# Patient Record
Sex: Female | Born: 1945 | Race: Black or African American | Hispanic: No | Smoking: Never smoker
Health system: Southern US, Community
[De-identification: ages and names within clinical notes are randomized; demographics above are authoritative.]

## PROBLEM LIST (undated history)

## (undated) DIAGNOSIS — Z8673 Personal history of transient ischemic attack (TIA), and cerebral infarction without residual deficits: Secondary | ICD-10-CM

## (undated) DIAGNOSIS — Z794 Long term (current) use of insulin: Secondary | ICD-10-CM

## (undated) DIAGNOSIS — E785 Hyperlipidemia, unspecified: Secondary | ICD-10-CM

## (undated) DIAGNOSIS — I5042 Chronic combined systolic (congestive) and diastolic (congestive) heart failure: Secondary | ICD-10-CM

## (undated) DIAGNOSIS — I2510 Atherosclerotic heart disease of native coronary artery without angina pectoris: Secondary | ICD-10-CM

## (undated) DIAGNOSIS — E119 Type 2 diabetes mellitus without complications: Secondary | ICD-10-CM

## (undated) DIAGNOSIS — R1310 Dysphagia, unspecified: Secondary | ICD-10-CM

---

## 2014-10-02 HISTORY — PX: CARDIAC CATHETERIZATION: SHX172

## 2018-01-02 ENCOUNTER — Emergency Department (HOSPITAL_COMMUNITY): Payer: Medicare Other

## 2018-01-02 ENCOUNTER — Inpatient Hospital Stay (HOSPITAL_COMMUNITY)
Admission: EM | Admit: 2018-01-02 | Discharge: 2018-01-08 | DRG: 638 | Disposition: A | Discharge status: Hospice | Payer: Medicare Other | Attending: Internal Medicine | Admitting: Internal Medicine

## 2018-01-02 ENCOUNTER — Encounter (HOSPITAL_COMMUNITY): Payer: Self-pay | Admitting: Nurse Practitioner

## 2018-01-02 DIAGNOSIS — Z515 Encounter for palliative care: Secondary | ICD-10-CM | POA: Diagnosis present

## 2018-01-02 DIAGNOSIS — Z66 Do not resuscitate: Secondary | ICD-10-CM | POA: Diagnosis present

## 2018-01-02 DIAGNOSIS — G9349 Other encephalopathy: Secondary | ICD-10-CM | POA: Diagnosis present

## 2018-01-02 DIAGNOSIS — L89152 Pressure ulcer of sacral region, stage 2: Secondary | ICD-10-CM | POA: Diagnosis present

## 2018-01-02 DIAGNOSIS — I69391 Dysphagia following cerebral infarction: Secondary | ICD-10-CM

## 2018-01-02 DIAGNOSIS — E11649 Type 2 diabetes mellitus with hypoglycemia without coma: Principal | ICD-10-CM | POA: Diagnosis present

## 2018-01-02 DIAGNOSIS — E119 Type 2 diabetes mellitus without complications: Secondary | ICD-10-CM

## 2018-01-02 DIAGNOSIS — I5042 Chronic combined systolic (congestive) and diastolic (congestive) heart failure: Secondary | ICD-10-CM | POA: Diagnosis present

## 2018-01-02 DIAGNOSIS — Z955 Presence of coronary angioplasty implant and graft: Secondary | ICD-10-CM

## 2018-01-02 DIAGNOSIS — I69354 Hemiplegia and hemiparesis following cerebral infarction affecting left non-dominant side: Secondary | ICD-10-CM | POA: Diagnosis not present

## 2018-01-02 DIAGNOSIS — R627 Adult failure to thrive: Secondary | ICD-10-CM | POA: Diagnosis present

## 2018-01-02 DIAGNOSIS — I2510 Atherosclerotic heart disease of native coronary artery without angina pectoris: Secondary | ICD-10-CM | POA: Diagnosis present

## 2018-01-02 DIAGNOSIS — Z794 Long term (current) use of insulin: Secondary | ICD-10-CM | POA: Diagnosis not present

## 2018-01-02 DIAGNOSIS — I252 Old myocardial infarction: Secondary | ICD-10-CM

## 2018-01-02 DIAGNOSIS — E876 Hypokalemia: Secondary | ICD-10-CM | POA: Diagnosis present

## 2018-01-02 DIAGNOSIS — Z7982 Long term (current) use of aspirin: Secondary | ICD-10-CM

## 2018-01-02 DIAGNOSIS — E162 Hypoglycemia, unspecified: Secondary | ICD-10-CM | POA: Diagnosis not present

## 2018-01-02 DIAGNOSIS — R1310 Dysphagia, unspecified: Secondary | ICD-10-CM | POA: Diagnosis present

## 2018-01-02 DIAGNOSIS — Z7901 Long term (current) use of anticoagulants: Secondary | ICD-10-CM | POA: Diagnosis not present

## 2018-01-02 DIAGNOSIS — I69322 Dysarthria following cerebral infarction: Secondary | ICD-10-CM | POA: Diagnosis not present

## 2018-01-02 DIAGNOSIS — R0689 Other abnormalities of breathing: Secondary | ICD-10-CM | POA: Diagnosis present

## 2018-01-02 DIAGNOSIS — N390 Urinary tract infection, site not specified: Secondary | ICD-10-CM | POA: Diagnosis present

## 2018-01-02 DIAGNOSIS — I69320 Aphasia following cerebral infarction: Secondary | ICD-10-CM

## 2018-01-02 DIAGNOSIS — F0390 Unspecified dementia without behavioral disturbance: Secondary | ICD-10-CM | POA: Diagnosis present

## 2018-01-02 DIAGNOSIS — E785 Hyperlipidemia, unspecified: Secondary | ICD-10-CM | POA: Diagnosis present

## 2018-01-02 DIAGNOSIS — Z7401 Bed confinement status: Secondary | ICD-10-CM

## 2018-01-02 DIAGNOSIS — E860 Dehydration: Secondary | ICD-10-CM | POA: Diagnosis present

## 2018-01-02 DIAGNOSIS — Z8673 Personal history of transient ischemic attack (TIA), and cerebral infarction without residual deficits: Secondary | ICD-10-CM

## 2018-01-02 DIAGNOSIS — B9620 Unspecified Escherichia coli [E. coli] as the cause of diseases classified elsewhere: Secondary | ICD-10-CM | POA: Diagnosis present

## 2018-01-02 HISTORY — DX: Personal history of transient ischemic attack (TIA), and cerebral infarction without residual deficits: Z86.73

## 2018-01-02 HISTORY — DX: Chronic combined systolic (congestive) and diastolic (congestive) heart failure: I50.42

## 2018-01-02 HISTORY — DX: Long term (current) use of insulin: Z79.4

## 2018-01-02 HISTORY — DX: Dysphagia, unspecified: R13.10

## 2018-01-02 HISTORY — DX: Hyperlipidemia, unspecified: E78.5

## 2018-01-02 HISTORY — DX: Type 2 diabetes mellitus without complications: E11.9

## 2018-01-02 HISTORY — DX: Atherosclerotic heart disease of native coronary artery without angina pectoris: I25.10

## 2018-01-02 LAB — URINALYSIS, ROUTINE W REFLEX MICROSCOPIC
Bilirubin Urine: NEGATIVE
GLUCOSE, UA: 50 mg/dL — AB
KETONES UR: 5 mg/dL — AB
NITRITE: NEGATIVE
PROTEIN: 100 mg/dL — AB
Specific Gravity, Urine: 1.021 (ref 1.005–1.030)
pH: 6 (ref 5.0–8.0)

## 2018-01-02 LAB — BASIC METABOLIC PANEL
ANION GAP: 16 — AB (ref 5–15)
BUN: 16 mg/dL (ref 6–20)
CHLORIDE: 96 mmol/L — AB (ref 101–111)
CO2: 28 mmol/L (ref 22–32)
Calcium: 9.5 mg/dL (ref 8.9–10.3)
Creatinine, Ser: 0.74 mg/dL (ref 0.44–1.00)
GFR calc Af Amer: >60 mL/min (ref >60)
GLUCOSE: 54 mg/dL — AB (ref 65–99)
POTASSIUM: 3.1 mmol/L — AB (ref 3.5–5.1)
Sodium: 140 mmol/L (ref 135–145)

## 2018-01-02 LAB — GLUCOSE, CAPILLARY
GLUCOSE-CAPILLARY: 148 mg/dL — AB (ref 65–99)
GLUCOSE-CAPILLARY: 94 mg/dL (ref 65–99)

## 2018-01-02 LAB — CBC WITH DIFFERENTIAL/PLATELET
BASOS ABS: 0 10*3/uL (ref 0.0–0.1)
Basophils Relative: 0 %
Eosinophils Absolute: 0 10*3/uL (ref 0.0–0.7)
Eosinophils Relative: 0 %
HEMATOCRIT: 47.3 % — AB (ref 36.0–46.0)
Hemoglobin: 15 g/dL (ref 12.0–15.0)
LYMPHS ABS: 2.4 10*3/uL (ref 0.7–4.0)
LYMPHS PCT: 32 %
MCH: 28.4 pg (ref 26.0–34.0)
MCHC: 31.7 g/dL (ref 30.0–36.0)
MCV: 89.4 fL (ref 78.0–100.0)
MONO ABS: 0.6 10*3/uL (ref 0.1–1.0)
Monocytes Relative: 7 %
NEUTROS ABS: 4.5 10*3/uL (ref 1.7–7.7)
Neutrophils Relative %: 61 %
Platelets: 295 10*3/uL (ref 150–400)
RBC: 5.29 MIL/uL — AB (ref 3.87–5.11)
RDW: 15.2 % (ref 11.5–15.5)
WBC: 7.5 10*3/uL (ref 4.0–10.5)

## 2018-01-02 LAB — CBG MONITORING, ED
GLUCOSE-CAPILLARY: 73 mg/dL (ref 65–99)
GLUCOSE-CAPILLARY: 90 mg/dL (ref 65–99)
Glucose-Capillary: 127 mg/dL — ABNORMAL HIGH (ref 65–99)
Glucose-Capillary: 34 mg/dL — CL (ref 65–99)
Glucose-Capillary: 93 mg/dL (ref 65–99)

## 2018-01-02 LAB — MAGNESIUM: MAGNESIUM: 1.7 mg/dL (ref 1.7–2.4)

## 2018-01-02 LAB — I-STAT TROPONIN, ED: Troponin i, poc: 0 ng/mL (ref 0.00–0.08)

## 2018-01-02 LAB — PHOSPHORUS: PHOSPHORUS: 2.8 mg/dL (ref 2.5–4.6)

## 2018-01-02 LAB — MRSA PCR SCREENING: MRSA by PCR: NEGATIVE

## 2018-01-02 MED ORDER — DEXTROSE 10 % IV SOLN
INTRAVENOUS | Status: DC
  Administered 2018-01-02: 75 mL/h via INTRAVENOUS
  Administered 2018-01-03 – 2018-01-04 (×2): via INTRAVENOUS
  Administered 2018-01-04 (×2): 1000 mL via INTRAVENOUS
  Administered 2018-01-05 – 2018-01-06 (×3): via INTRAVENOUS
  Filled 2018-01-02: qty 1000

## 2018-01-02 MED ORDER — POTASSIUM CHLORIDE 10 MEQ/100ML IV SOLN
10.0000 meq | INTRAVENOUS | Status: AC
  Administered 2018-01-02: 10 meq via INTRAVENOUS
  Filled 2018-01-02: qty 100

## 2018-01-02 MED ORDER — ONDANSETRON HCL 4 MG/2ML IJ SOLN: 4.0000 mg | Freq: Four times a day (QID) | INTRAMUSCULAR | Status: DC | PRN

## 2018-01-02 MED ORDER — DEXTROSE 50 % IV SOLN
1.0000 | Freq: Once | INTRAVENOUS | Status: AC
  Administered 2018-01-02: 50 mL via INTRAVENOUS
  Filled 2018-01-02: qty 50

## 2018-01-02 MED ORDER — ONDANSETRON HCL 4 MG PO TABS: 4.0000 mg | ORAL_TABLET | Freq: Four times a day (QID) | ORAL | Status: DC | PRN

## 2018-01-02 MED ORDER — POTASSIUM CHLORIDE 10 MEQ/100ML IV SOLN
10.0000 meq | INTRAVENOUS | Status: AC
  Administered 2018-01-02 (×2): 10 meq via INTRAVENOUS
  Filled 2018-01-02 (×2): qty 100

## 2018-01-02 MED ORDER — SODIUM CHLORIDE 0.9 % IV BOLUS
500.0000 mL | Freq: Once | INTRAVENOUS | Status: AC
  Administered 2018-01-02: 500 mL via INTRAVENOUS

## 2018-01-02 MED ORDER — APIXABAN 5 MG PO TABS
5.0000 mg | ORAL_TABLET | Freq: Two times a day (BID) | ORAL | Status: DC
  Filled 2018-01-02 (×5): qty 1

## 2018-01-02 MED ORDER — ACETAMINOPHEN 650 MG RE SUPP: 650.0000 mg | Freq: Four times a day (QID) | RECTAL | Status: DC | PRN

## 2018-01-02 MED ORDER — POTASSIUM CHLORIDE 10 MEQ/100ML IV SOLN
10.0000 meq | Freq: Once | INTRAVENOUS | Status: AC
  Administered 2018-01-02: 10 meq via INTRAVENOUS
  Filled 2018-01-02: qty 100

## 2018-01-02 MED ORDER — ACETAMINOPHEN 325 MG PO TABS: 650.0000 mg | ORAL_TABLET | Freq: Four times a day (QID) | ORAL | Status: DC | PRN

## 2018-01-02 NOTE — Discharge Planning (Signed)
Pt newphew, Jonathan [facility].  Carley Hammedva of [facility] states they were to visit pt today to begin services.  Please notify [facility] if plans change.

## 2018-01-02 NOTE — Progress Notes (Signed)
Attempted to get report on pt.  

## 2018-01-02 NOTE — H&P (Signed)
History and Physical    Heather Schultz ZOX:[id number] DOB: 03/28/1946 DOA: 01/02/2018  **Will admit patient based on the expectation that the patient will need hospitalization/ hospital care that crosses at least 2 midnights  PCP: Macario Carls, PA-C in [place] [phone]  Attending physician: Ophelia Charter  Patient coming from/Resides with: [facility]  Chief Complaint: Altered mental status, severe hypoglycemia  HPI: Heather Schultz is a 72 y.o. female with medical history significant for multiple CVA with the last one 3 weeks ago, history of CAD and prior MI with stents, chronic combined systolic and diastolic heart failure, diabetes mellitus on insulin, dysphagia and failure to thrive.  According to family at the bedside patient had 2 strokes 3 years ago and most recent stroke was about 3-4 weeks ago.  She was initially at [facility] health at that time she was ambulating with assistance and a rolling walker.  Over the past several weeks she has subsequently been transferred to new nursing facility and has had progressive decline in function.  She has stopped eating in the context of dislike of pured diet with thickened liquids.  She is also refused her medications.  She has had mottling of the hands and legs for several months.  Is recently been made a DNR and placed on hospice because of all of these changes.  Today when patient was found unresponsive her CBG was checked and was 16.  EMS was called and she was given IV glucagon with improvement in CBG to 73.  She has subsequently been started on a D10 infusion in the ER at 75/HR.  She is POA/nephew was contacted by ED staff who confirms DNR status but wishes to treat reversible causes stating to ED PA that "we would like everything done up until she flat lines i.e. codes if possible" ER patient did not have leukocytosis.  She did have hypokalemia and serum glucose 54.  Urinalysis is pending.  She is receiving normal saline  bolus and dextrose as above.  Upon my interaction with the patient she was tracking me visually in the room.  She was able to follow commands.  She stated that she was "hungry".  Review of nursing facility medication sheet the following medications have recently been discontinued: Lisinopril, ranitidine, atorvastatin, Zetia and metformin.  Patient's Evaristo Bury insulin was decreased from 60 units to 30 units daily on 4/2.  ED Course:  Vital Signs: BP (!) 171/86   Pulse 91   Temp 98 F (36.7 C) (Rectal)   Resp 16   SpO2 99%  CXR: Neg Pertinent lab data: Sodium 140, potassium 3.1, chloride 96, glucose 54, BUN 16, creatinine 0.74, anion gap 16, hemoglobin 15, hematocrit 47.3, platelets 295,000, white count 7500 with normal differential, urinalysis and culture pending collection Medications and treatments: Potassium 10 mEq x1, normal saline 500 cc x1, D50 1 amp x1, D10 infusion at 75/hr  Review of Systems:  **Unable to obtain from patient due to altered mental status, history of stroke and significant dysarthria **From family member at bedside and from limited medical records sent with patient from SNF   Past Medical History:  Diagnosis Date  . CAD (coronary artery disease) MI w/stents 2016   . Chronic combined systolic and diastolic CHF (congestive heart failure) (HCC)   . Diabetes mellitus type 2, insulin dependent (HCC)   . Dysphagia   . History of CVA (cerebrovascular accident)   . HLD (hyperlipidemia)     Past Surgical History:  Procedure Laterality Date  .  CARDIAC CATHETERIZATION  2016    Social History   Socioeconomic History  . Marital status: Unknown    Spouse name: Not on file  . Number of children: Not on file  . Years of education: Not on file  . Highest education level: Not on file  Occupational History  . Not on file  Social Needs  . Financial resource strain: Not on file  . Food insecurity:    Worry: Not on file    Inability: Not on file  . Transportation needs:     Medical: Not on file    Non-medical: Not on file  Tobacco Use  . Smoking status: Never Smoker  . Smokeless tobacco: Never Used  Substance and Sexual Activity  . Alcohol use: Never    Frequency: Never  . Drug use: Never  . Sexual activity: Not Currently  Lifestyle  . Physical activity:    Days per week: 0 days    Minutes per session: 0 min  . Stress: Not on file  Relationships  . Social connections:    Talks on phone: Not on file    Gets together: Not on file    Attends religious service: Not on file    Active member of club or organization: Not on file    Attends meetings of clubs or organizations: Not on file    Relationship status: Not on file  . Intimate partner violence:    Fear of current or ex partner: Not on file    Emotionally abused: Not on file    Physically abused: Not on file    Forced sexual activity: Not on file  Other Topics Concern  . Not on file  Social History Narrative   DO NOT RESUSCITATE    Mobility: Bed bound Work history: Not obtained   No Known Allergies  Family History  Family history unknown: Yes     Prior to Admission medications   Medication Sig Start Date End Date Taking? Authorizing Provider  acetaminophen (TYLENOL) 325 MG tablet Take 650 mg by mouth every 4 (four) hours as needed for mild pain.   Yes [provider]  apixaban (ELIQUIS) 5 MG TABS tablet Take 5 mg by mouth 2 (two) times daily.   Yes [provider]  aspirin 81 MG chewable tablet Chew 81 mg by mouth daily.   Yes [provider]  carvedilol (COREG) 25 MG tablet Take 25 mg by mouth 2 (two) times daily with a meal.   Yes [provider]  dextromethorphan-guaiFENesin (MUCINEX DM) 30-600 MG 12hr tablet Take 2 tablets by mouth daily as needed for cough.   Yes [provider]  furosemide (LASIX) 20 MG tablet Take 10 mg by mouth daily.   Yes [provider]  Insulin Degludec (TRESIBA FLEXTOUCH) 200 UNIT/ML SOPN Inject 30  Units into the skin daily.   Yes [provider]  mirtazapine (REMERON) 7.5 MG tablet Take 7.5 mg by mouth at bedtime.   Yes [provider]  nitroGLYCERIN (NITROSTAT) 0.4 MG SL tablet Place 0.4 mg under the tongue every 5 (five) minutes as needed for chest pain.   Yes [provider]  PARoxetine (PAXIL) 20 MG tablet Take 20 mg by mouth daily.   Yes [provider]    Physical Exam: Vitals:   01/02/18 0849 01/02/18 0915 01/02/18 0930 01/02/18 1023  BP:    (!) 171/86  Pulse:  88 92 91  Resp:      Temp: 98 F (36.7  C)     TempSrc: Rectal     SpO2:  98% 99% 99%      Constitutional: NAD, calm, comfortable Eyes: PERRL, lids and conjunctivae normal ENMT: Mucous membranes are dry. Posterior pharynx clear of any exudate or lesions.poor dentition Neck: normal, supple, no masses, no thyromegaly Respiratory: clear to auscultation bilaterally, no wheezing, no crackles. Normal respiratory effort. No accessory muscle use.  Cardiovascular: Regular rate and rhythm, no murmurs / rubs / gallops. No extremity edema. 1+ pedal pulses. No carotid bruits.  Mottling of hands and feet as described below. Abdomen: no tenderness, no masses palpated. No hepatosplenomegaly. Bowel sounds positive.  Musculoskeletal: no clubbing / cyanosis. No joint deformity upper and lower extremities. Good ROM, no contractures. Normal muscle tone.  Skin: no rashes, lesions, ulcers. No induration-marked mottling of right hand and bilateral lower extremities from mid anterior tibs down with right hand and feet lower legs extremely cool to touch. Neurologic: CN 2-12 grossly intact-mild baseline bilateral vertical nystagmus at rest without testing.  Tongue midline on testing.  Sensation intact, DTR normal. Strength 4/5 x RUE/RLE; dense hemiplegia LUE with strength 1/5 LLE.  Minimally verbal interactive although was able to state that she was hungry.  Tracks examiner around the room with eyes.  Able to  follow commands easily. Psychiatric: Alert and oriented x name.  Flat affect   Labs on Admission: I have personally reviewed following labs and imaging studies  CBC: Recent Labs  Lab 01/02/18 0804  WBC 7.5  NEUTROABS 4.5  HGB 15.0  HCT 47.3*  MCV 89.4  PLT 295   Basic Metabolic Panel: Recent Labs  Lab 01/02/18 0804  NA 140  K 3.1*  CL 96*  CO2 28  GLUCOSE 54*  BUN 16  CREATININE 0.74  CALCIUM 9.5   GFR: CrCl cannot be calculated (Unknown ideal weight.). Liver Function Tests: No results for input(s): AST, ALT, ALKPHOS, BILITOT, PROT, ALBUMIN in the last 168 hours. No results for input(s): LIPASE, AMYLASE in the last 168 hours. No results for input(s): AMMONIA in the last 168 hours. Coagulation Profile: No results for input(s): INR, PROTIME in the last 168 hours. Cardiac Enzymes: No results for input(s): CKTOTAL, CKMB, CKMBINDEX, TROPONINI in the last 168 hours. BNP (last 3 results) No results for input(s): PROBNP in the last 8760 hours. HbA1C: No results for input(s): HGBA1C in the last 72 hours. CBG: Recent Labs  Lab 01/02/18 0755 01/02/18 1032 01/02/18 1049 01/02/18 1145  GLUCAP 73 34* 127* 90   Lipid Profile: No results for input(s): CHOL, HDL, LDLCALC, TRIG, CHOLHDL, LDLDIRECT in the last 72 hours. Thyroid Function Tests: No results for input(s): TSH, T4TOTAL, FREET4, T3FREE, THYROIDAB in the last 72 hours. Anemia Panel: No results for input(s): VITAMINB12, FOLATE, FERRITIN, TIBC, IRON, RETICCTPCT in the last 72 hours. Urine analysis: No results found for: COLORURINE, APPEARANCEUR, LABSPEC, PHURINE, GLUCOSEU, HGBUR, BILIRUBINUR, KETONESUR, PROTEINUR, UROBILINOGEN, NITRITE, LEUKOCYTESUR Sepsis Labs: @LABRCNTIP (procalcitonin:4,lacticidven:4) )No results found for this or any previous visit (from the past 240 hour(s)).   Radiological Exams on Admission: Dg Chest 1 View  Result Date: 01/02/2018 CLINICAL DATA:  Hypoglycemia.  Abnormal breath sounds  EXAM: CHEST  1 VIEW COMPARISON:  None. FINDINGS: There is minimal atelectasis in the left base. Lungs elsewhere are clear. Heart size and pulmonary vascularity are normal. No adenopathy. There is a loop recorder on the left inferiorly. IMPRESSION: Minimal left base atelectasis. No edema or consolidation. Heart size normal. Electronically Signed   By: Bretta Bang III M.D.  On: 01/02/2018 09:22    EKG: (Independently reviewed)   Assessment/Plan Principal Problem:   Hypoglycemia/ Diabetes mellitus type 2, insulin dependent  -Patient presents with altered mental status in the context of symptomatic hypoglycemia with presenting CBG 16 that has improved after administration of glucose IV -Patient with significant failure to thrive noting she was not eating or drinking for multiple days prior to presentation -Hold insulin degludec -CBGs every 4 hours -Encourage oral intake; family member has noted that with patient on thickened liquids and pured diet patient has not wanted to eat as previous -Until patient begins to eat regular amounts suggest to withhold long-acting insulin in favor of following CBGs and providing SSI -Look for occult infections therefore will follow up on urinalysis, urine culture and blood cultures obtained in ER-no obvious signs of infection so no indication to initiate empiric antibiotics  Active Problems:   Acute hypokalemia -Secondary to poor intake, dehydration and recent concomitant administration of Lasix -IV replete -Follow labs    FTT (failure to thrive) in adult/Dysphagia -Patient with recurrent CVA with most recent about 3-4 weeks ago with worsening of left-sided deficits, difficulty communicating with persistent dysarthria and profound dysphagia -We will allow for pured diet with honey thickened liquids but asked for SLP evaluation in a.m.-HOB elevated 60 degrees with feeding and meds -Patient recently made DNR and placed on hospice status -She appears  quite dehydrated as evidenced by a cool mottled extremities-continue hydration    History of CVA (cerebrovascular accident) -Irving Burtonmily reports 4 weeks ago patient was ambulatory with assistance and rolling walker -PT/OT evaluation -Patient on full dose Eliquis-medical history not available but no apparent history of atrial fibrillation so suspect full dose anticoagulation secondary to recurrent strokes    Chronic combined systolic and diastolic CHF (congestive heart failure)  -Appears euvolemic and dry -Recently Lasix and lisinopril discontinued in context of failure to thrive    HLD (hyperlipidemia) -Statin recently discontinued    CAD (coronary artery disease) w/ prior MI/stents 2016 -Statin recently discontinued -On Eliquis and aspirin    **Additional lab, imaging and/or diagnostic evaluation at discretion of supervising physician  DVT prophylaxis: Eliquis Code Status: DO NOT RESUSCITATE Family Communication: Family member at bedside Disposition Plan: SNF Consults called: None    Ralphine Hinks L. ANP-BC [facility] Pager [phone]   If 7PM-7AM, please contact night-coverage [email or web address] Password [email or web address]  01/02/2018, 12:10 PM

## 2018-01-02 NOTE — ED Notes (Signed)
Pt not tolerating Potassium infusion well, rate decreased to 50 mL/hr, pt tolerating well with new rate

## 2018-01-02 NOTE — ED Triage Notes (Signed)
Pt arrives from [facility] for low blood sugar, ems arrived and found a unresponsive patient with a blood sugar of 16. Ems administered 1 mg of IM glucagon and pt's blood sugar 73 on arrival to ED. Pt is non verbal but alert to voice, pt tries to speak but makes raspy noise. Pt arrives with a DNR that appears to be placed in affect yesterday. Nursing home was unable to give ems any valuable information regarding history they only told ems that patient was a new hospice patient. pt's lower legs appear mottled, pt has noisy respirations.

## 2018-01-02 NOTE — ED Provider Notes (Signed)
[facility] EMERGENCY DEPARTMENT Provider Note   CSN: [id number] Arrival date & time:        History   Chief Complaint Chief Complaint  Patient presents with  . Hypoglycemia    HPI Amie Cowens is a 72 y.o. female who is currently DNR and on hospice effective yesterday for multiple CVA, cardiac issues, who presents to ED for hypoglycemia.  Per EMS, patient was found unresponsive and had a blood sugar of 16.  EMS administered 1 mg of IM glucagon with improvement in blood sugar to 73.  Patient is nonverbal at baseline.  Remainder of history is limited due to patient's condition. Spoke to patient's nephew and niece who states that patient, up until yesterday, was on physical therapy and speech therapy for progress after CVAs.  However, she gradually began rejecting her food and medications so they placed her on hospice.  She currently resides at [facility].  Per Loraine Leriche, all medications were discontinued with the exception of Lasix, carvedilol, Eliquis.  HPI  No past medical history on file.  Patient Active Problem List   Diagnosis Date Noted  . Hypoglycemia 01/02/2018      OB History   None      Home Medications    Prior to Admission medications   Medication Sig Start Date End Date Taking? Authorizing Provider  acetaminophen (TYLENOL) 325 MG tablet Take 650 mg by mouth every 4 (four) hours as needed for mild pain.   Yes [provider]  apixaban (ELIQUIS) 5 MG TABS tablet Take 5 mg by mouth 2 (two) times daily.   Yes [provider]  aspirin 81 MG chewable tablet Chew 81 mg by mouth daily.   Yes [provider]  carvedilol (COREG) 25 MG tablet Take 25 mg by mouth 2 (two) times daily with a meal.   Yes [provider]  dextromethorphan-guaiFENesin (MUCINEX DM) 30-600 MG 12hr tablet Take 2 tablets by mouth daily as needed for cough.   Yes [provider]  furosemide (LASIX) 20 MG tablet Take 10 mg  by mouth daily.   Yes [provider]  Insulin Degludec (TRESIBA FLEXTOUCH) 200 UNIT/ML SOPN Inject 30 Units into the skin daily.   Yes [provider]  mirtazapine (REMERON) 7.5 MG tablet Take 7.5 mg by mouth at bedtime.   Yes [provider]  nitroGLYCERIN (NITROSTAT) 0.4 MG SL tablet Place 0.4 mg under the tongue every 5 (five) minutes as needed for chest pain.   Yes [provider]  PARoxetine (PAXIL) 20 MG tablet Take 20 mg by mouth daily.   Yes [provider]    Family History No family history on file.  Social History Social History   Tobacco Use  . Smoking status: Not on file  Substance Use Topics  . Alcohol use: Not on file  . Drug use: Not on file     Allergies   Patient has no known allergies.   Review of Systems Review of Systems  Unable to perform ROS: Patient nonverbal     Physical Exam Updated Vital Signs BP (!) 171/86   Pulse 91   Temp 98 F (36.7 C) (Rectal)   Resp 16   SpO2 99%   Physical Exam  Constitutional: She appears well-developed and well-nourished. No distress.  NAD. Able to nod and shake head when asked questions.  HENT:  Head: Normocephalic and atraumatic.  Nose: Nose normal.  Eyes: Pupils are equal, round, and reactive to  light. Conjunctivae and EOM are normal. Right eye exhibits no discharge. Left eye exhibits no discharge. No scleral icterus.  Cardiovascular: Normal rate, regular rhythm, normal heart sounds and intact distal pulses. Exam reveals no gallop and no friction rub.  No murmur heard. Pulmonary/Chest: Effort normal and breath sounds normal. No respiratory distress.  Coarse breath sounds in bilateral lung fields.  Abdominal: Soft. Bowel sounds are normal. She exhibits no distension. There is no tenderness. There is no guarding.  Musculoskeletal: She exhibits no edema.  Mottled lower extremities bilaterally. Cool to touch.  Neurological: She is alert.  Head turned to R side.    Skin: Skin is warm and dry. No rash noted. She is not diaphoretic.  Psychiatric: She has a normal mood and affect.  Nursing note and vitals reviewed.    ED Treatments / Results  Labs (all labs ordered are listed, but only abnormal results are displayed) Labs Reviewed  BASIC METABOLIC PANEL - Abnormal; Notable for the following components:      Result Value   Potassium 3.1 (*)    Chloride 96 (*)    Glucose, Bld 54 (*)    Anion gap 16 (*)    All other components within normal limits  CBC WITH DIFFERENTIAL/PLATELET - Abnormal; Notable for the following components:   RBC 5.29 (*)    HCT 47.3 (*)    All other components within normal limits  CBG MONITORING, ED - Abnormal; Notable for the following components:   Glucose-Capillary 34 (*)    All other components within normal limits  CBG MONITORING, ED - Abnormal; Notable for the following components:   Glucose-Capillary 127 (*)    All other components within normal limits  URINE CULTURE  URINALYSIS, ROUTINE W REFLEX MICROSCOPIC  URINALYSIS, ROUTINE W REFLEX MICROSCOPIC  CBG MONITORING, ED  I-STAT TROPONIN, ED    EKG EKG Interpretation  Date/Time:  Wednesday January 02 2018 08:11:55 EDT Ventricular Rate:  96 PR Interval:    QRS Duration: 93 QT Interval:  368 QTC Calculation: 465 R Axis:   66 Text Interpretation:  Sinus rhythm Probable anteroseptal infarct, old some artifact No old tracing to compare Confirmed by Jerelyn Scott [phone]) on 01/02/2018 8:17:25 AM   Radiology Dg Chest 1 View  Result Date: 01/02/2018 CLINICAL DATA:  Hypoglycemia.  Abnormal breath sounds EXAM: CHEST  1 VIEW COMPARISON:  None. FINDINGS: There is minimal atelectasis in the left base. Lungs elsewhere are clear. Heart size and pulmonary vascularity are normal. No adenopathy. There is a loop recorder on the left inferiorly. IMPRESSION: Minimal left base atelectasis. No edema or consolidation. Heart size normal. Electronically Signed   By: Bretta Bang  III M.D.   On: 01/02/2018 09:22    Procedures Procedures (including critical care time)  CRITICAL CARE Performed by: Dietrich Pates   Total critical care time: 35 minutes  Critical care time was exclusive of separately billable procedures and treating other patients.  Critical care was necessary to treat or prevent imminent or life-threatening deterioration.  Critical care was time spent personally by me on the following activities: development of treatment plan with patient and/or surrogate as well as nursing, discussions with consultants, evaluation of patient's response to treatment, examination of patient, obtaining history from patient or surrogate, ordering and performing treatments and interventions, ordering and review of laboratory studies, ordering and review of radiographic studies, pulse oximetry and re-evaluation of patient's condition.   Medications Ordered in ED Medications  potassium chloride 10 mEq in 100 mL IVPB (  10 mEq Intravenous New Bag/Given 01/02/18 1027)  sodium chloride 0.9 % bolus 500 mL (500 mLs Intravenous New Bag/Given 01/02/18 1028)  dextrose 10 % infusion (has no administration in time range)  potassium chloride 10 mEq in 100 mL IVPB (has no administration in time range)  dextrose 50 % solution 50 mL (50 mLs Intravenous Given 01/02/18 1036)     Initial Impression / Assessment and Plan / ED Course  I have reviewed the triage vital signs and the nursing notes.  Pertinent labs & imaging results that were available during my care of the patient were reviewed by me and considered in my medical decision making (see chart for details).     Patient presents to ED for evaluation of hypoglycemia.  Patient resides at a nursing facility.  She was placed under hospice and DNR effective yesterday.  It appears that patient has had multiple strokes and stents placed in the past.  She was going through speech and physical therapy until yesterday.  Her family states that she  has gradually been rejecting her food and medications.  They found her unresponsive earlier this morning with glucose of 16.  This responded well to IV glucagon.  Patient's lab work significant for hypokalemia at 3.1 and glucose as low as 34 here.  I spoke to and explained to the family about the patient's condition and the fact that she will need to be on a D10 gtt to help maintain her blood sugars.  Not sure if they are fully grasping the patient's condition, as they ask "so she will be on the drip until her glucose is normal?" I explained to them that the reason the patient's glucose is low is because of her PO intake so unless this improves, she may need to be on the gtt. They would like to pursue all measures until the time of resuscitation if needed.  Spoke to the hospitalist for admission for hypoglycemia. Appreciate the help of hospitalist for management of this patient.  Portions of this note were generated with Scientist, clinical (histocompatibility and immunogenetics)Dragon dictation software. Dictation errors may occur despite best attempts at proofreading.   Final Clinical Impressions(s) / ED Diagnoses   Final diagnoses:  Hypoglycemia    ED Discharge Orders    None       Dietrich PatesKhatri, Aayushi Solorzano, PA-C 01/02/18 1116    Mabe, Latanya MaudlinMartha L, MD 01/02/18 1130

## 2018-01-02 NOTE — Progress Notes (Signed)
Heather Schultz is a 72 y.o. female patient admitted from ED awake, alert - nonverbal (base line) - no acute distress noted.  VSS - Blood pressure (!) 141/75, pulse 92, temperature 98 F (36.7 C), temperature source Rectal, resp. rate 20, SpO2 99 %.    IV in place, occlusive dsg intact without redness.  SR up x 2, fall assessment complete. Pt has a stage 2 pressure injury on her sacrum. No other evidence of skin break down noted on exam.     Will cont to eval and treat per MD orders.  Theodosia BlenderEvan J Windsor Goeken, RN 01/02/2018 5:33 PM

## 2018-01-02 NOTE — ED Notes (Signed)
PA at bedside, plan to speak with family re: plan of care, pt has DNR paper with effective date 01/01/18, will contyinue to monitor

## 2018-01-02 NOTE — ED Notes (Signed)
PA aware of CBG, plan to start dextrose infusion

## 2018-01-03 LAB — CBC
HCT: 40.3 % (ref 36.0–46.0)
Hemoglobin: 12.6 g/dL (ref 12.0–15.0)
MCH: 28.1 pg (ref 26.0–34.0)
MCHC: 31.3 g/dL (ref 30.0–36.0)
MCV: 90 fL (ref 78.0–100.0)
PLATELETS: 261 10*3/uL (ref 150–400)
RBC: 4.48 MIL/uL (ref 3.87–5.11)
RDW: 15.4 % (ref 11.5–15.5)
WBC: 7.1 10*3/uL (ref 4.0–10.5)

## 2018-01-03 LAB — COMPREHENSIVE METABOLIC PANEL
ALT: 19 U/L (ref 14–54)
AST: 29 U/L (ref 15–41)
Albumin: 3 g/dL — ABNORMAL LOW (ref 3.5–5.0)
Alkaline Phosphatase: 71 U/L (ref 38–126)
Anion gap: 12 (ref 5–15)
BUN: 12 mg/dL (ref 6–20)
CHLORIDE: 97 mmol/L — AB (ref 101–111)
CO2: 25 mmol/L (ref 22–32)
CREATININE: 0.92 mg/dL (ref 0.44–1.00)
Calcium: 8.3 mg/dL — ABNORMAL LOW (ref 8.9–10.3)
GFR calc non Af Amer: >60 mL/min (ref >60)
Glucose, Bld: 94 mg/dL (ref 65–99)
Potassium: 3 mmol/L — ABNORMAL LOW (ref 3.5–5.1)
SODIUM: 134 mmol/L — AB (ref 135–145)
Total Bilirubin: 1.4 mg/dL — ABNORMAL HIGH (ref 0.3–1.2)
Total Protein: 6.1 g/dL — ABNORMAL LOW (ref 6.5–8.1)

## 2018-01-03 LAB — GLUCOSE, CAPILLARY
Glucose-Capillary: 89 mg/dL (ref 65–99)
Glucose-Capillary: 115 mg/dL — ABNORMAL HIGH (ref 65–99)
Glucose-Capillary: 97 mg/dL (ref 65–99)
Glucose-Capillary: 127 mg/dL — ABNORMAL HIGH (ref 65–99)

## 2018-01-03 MED ORDER — RESOURCE THICKENUP CLEAR PO POWD
ORAL | Status: DC | PRN
  Filled 2018-01-03: qty 125

## 2018-01-03 MED ORDER — SODIUM CHLORIDE 0.9 % IV SOLN
1.0000 g | INTRAVENOUS | Status: AC
  Administered 2018-01-03 – 2018-01-07 (×5): 1 g via INTRAVENOUS
  Filled 2018-01-03: qty 10
  Filled 2018-01-03: qty 1
  Filled 2018-01-03 (×3): qty 10

## 2018-01-03 NOTE — Evaluation (Signed)
Clinical/Bedside Swallow Evaluation Patient Details  Name: Anna Beaird MRN: [id number] Date of Birth: 09-05-1946  Today's Date: 01/03/2018 Time: SLP Start Time (ACUTE ONLY): 0820 SLP Stop Time (ACUTE ONLY): 0850 SLP Time Calculation (min) (ACUTE ONLY): 30 min  Past Medical History:  Past Medical History:  Diagnosis Date  . CAD (coronary artery disease) MI w/stents 2016   . Chronic combined systolic and diastolic CHF (congestive heart failure) (HCC)   . Diabetes mellitus type 2, insulin dependent (HCC)   . Dysphagia   . History of CVA (cerebrovascular accident)   . HLD (hyperlipidemia)    Past Surgical History:  Past Surgical History:  Procedure Laterality Date  . CARDIAC CATHETERIZATION  2016   HPI:  Noralee Dutko is a 72 y.o. female who is currently DNR and on hospice effective 4/2 for multiple CVA, cardiac issues, who presents to ED for hypoglycemia.  Per EMS, patient was found unresponsive and had a blood sugar of 16.  Patient is nonverbal at baseline. She gradually began rejecting her food and medications so they placed her on hospice.  She currently resides at [facility].  Per report, all medications were discontinued with the exception of Lasix, carvedilol, Eliquis.   Assessment / Plan / Recommendation Clinical Impression    Pt presents with severe oropharyngeal dysphagia and concomitant aspiration risk across all consistencies.  Pt has generalized oral motor weakness and significant cognitive deficits from multiple previous CVAs and acute decompensation with resultant absence of oral manipulation of boluses.  Pt would orally accept boluses but was incapable of containing or transiting boluses posteriorly.  As a result, all boluses completely spilled out of the oral cavity anteriorly or had to be manually removed from pt's mouth.  Pt had little interest in teaspoons of liquids or purees; however, she did express pleasure via facial expressions and yes/no  responses when presented with moist oral swabs.  Can not recommend a safe diet at this time given the deficits mentioned above.  Suspect that any POs at this time would be given with the intention of maximizing pt's comfort and quality of life; however, since no family is present to confirm that comfort feeds are within pt's wishes and pt can not express her wishes herself, will recommend NPO, meds crushed in puree at this time with frequent oral care and moist swabs for comfort.    Will follow up while inpatient in the hopes of providing family education for comfort feeds if that is the pt's and family's wish. SLP Visit Diagnosis: Dysphagia, oropharyngeal phase (R13.12)    Aspiration Risk  Severe aspiration risk;Risk for inadequate nutrition/hydration    Diet Recommendation NPO except meds(moist mouth swabs following oral care)   Liquid Administration via: Spoon Medication Administration: Crushed with puree Supervision: Full supervision/cueing for compensatory strategies Compensations: Other (Comment);Small sips/bites(feeds for comfort)    Other  Recommendations Oral Care Recommendations: Oral care QID   Follow up Recommendations Other (comment)(hospice)      Frequency and Duration min 1 x/week          Prognosis Prognosis for Safe Diet Advancement: (na, pt appears to be nearing end of life) Barriers to Reach Goals: Other (Comment)(na, pt appears to be nearing end of life)      Swallow Study   General Date of Onset: (see HPI) HPI: Ikeisha Blumberg is a 72 y.o. female who is currently DNR and on hospice effective 4/2 for multiple CVA, cardiac issues, who presents to ED for hypoglycemia.  Per EMS,  patient was found unresponsive and had a blood sugar of 16.  Patient is nonverbal at baseline. She gradually began rejecting her food and medications so they placed her on hospice.  She currently resides at [facility].  Per report, all medications were discontinued with the  exception of Lasix, carvedilol, Eliquis. Type of Study: Bedside Swallow Evaluation Previous Swallow Assessment: none on record, per RN at SNF, nursing and SLP were trying thickened liquids recently due to coughing and pocketing during meals and pt was refusing to eat Diet Prior to this Study: Dysphagia 1 (puree);Honey-thick liquids Temperature Spikes Noted: No Respiratory Status: Room air History of Recent Intubation: No Behavior/Cognition: Requires cueing;Confused;Cooperative;Alert Oral Cavity Assessment: Dry Oral Care Completed by SLP: Yes Oral Cavity - Dentition: Adequate natural dentition Self-Feeding Abilities: Total assist Patient Positioning: Upright in bed Baseline Vocal Quality: Other (comment)(aphonic) Volitional Cough: Cognitively unable to elicit Volitional Swallow: Unable to elicit    Oral/Motor/Sensory Function Overall Oral Motor/Sensory Function: Generalized oral weakness   Ice Chips     Thin Liquid Thin Liquid: Impaired Presentation: Spoon Oral Phase Impairments: Reduced labial seal;Reduced lingual movement/coordination;Impaired mastication;Poor awareness of bolus Oral Phase Functional Implications: Right anterior spillage;Left anterior spillage;Prolonged oral transit;Right lateral sulci pocketing;Oral residue;Oral holding Pharyngeal  Phase Impairments: Unable to trigger swallow    Nectar Thick     Honey Thick     Puree Puree: Impaired Presentation: Spoon Oral Phase Impairments: Reduced labial seal;Reduced lingual movement/coordination;Poor awareness of bolus Oral Phase Functional Implications: Right anterior spillage;Right lateral sulci pocketing;Oral residue;Oral holding;Prolonged oral transit Pharyngeal Phase Impairments: Unable to trigger swallow   Solid   GO            Herschel Fleagle, Melanee SpryNicole L 01/03/2018,9:22 AM

## 2018-01-03 NOTE — Progress Notes (Signed)
OT Cancellation Note and Discharge (Defer to Next Venue of Care)  Patient Details Name: Heather Schultz MRN: [id number] DOB: 1946/06/25   Cancelled Treatment:    Reason Eval/Treat Not Completed: OT screened, will defer OT to SNF where the patient resides for better environmental evaluation for self-feeding as per notes she is non-verbal, and bed bound - requiring assist for all ADL.   Evern BioLaura J Macari Zalesky 01/03/2018, 5:55 PM  Sherryl MangesLaura Nikko Goldwire OTR/L [phone]

## 2018-01-03 NOTE — Progress Notes (Signed)
Pt did not void through night. Bladder scan completed 3 times with volume not reaching over 150 ml.

## 2018-01-03 NOTE — Progress Notes (Signed)
PT Cancellation Note  Patient Details Name: Heather Schultz MRN: [id number] DOB: 09/24/1946   Cancelled Treatment:    Reason Eval/Treat Not Completed: PT screened, no needs identified, will sign off.  Pt is not able to assist with purposeful movement per nsg, unable to benefit from PT and is very fragile.  Will sign off for now and will be glad to come back if needs are identified that are not evident now.   Ivar DrapeRuth E Adlynn Lowenstein 01/03/2018, 8:30 AM   Samul Dadauth Caydance Kuehnle, PT MS Acute Rehab Dept. Number: [facility] [id number] and [facility] [phone]

## 2018-01-03 NOTE — Progress Notes (Signed)
PROGRESS NOTE  Jacquelene Kopecky ZOX:[id number] DOB: [birth year] DOA: 01/02/2018 PCP: Patient, No Pcp Per   LOS: 1 day   Brief Narrative / Interim history: 72 y.o. female with a Past Medical History of HLD, h/o CVA with resultant nonverbal status, DM, chronic combined CHF, and CAD with stents who presents with AMS and hypoglycemia.  The patient was found unresponsive at her facility and her blood sugar by EMS was 16.  She has recently been rejecting food and medications at SNF and has been placed on Hospice  Assessment & Plan: Principal Problem:   Hypoglycemia Active Problems:   Acute hypokalemia   FTT (failure to thrive) in adult   History of CVA (cerebrovascular accident)   Dysphagia   HLD (hyperlipidemia)   CAD (coronary artery disease) w/ prior MT/stents 2016   Diabetes mellitus type 2, insulin dependent (HCC)   Chronic combined systolic and diastolic CHF (congestive heart failure) (HCC)   Hypoglycemia -Likely in the setting of insulin-dependent diabetes mellitus, patient with poor p.o. 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 coli, difficult to say whether she is symptomatic, may be just bacteremia, however given changes in her mental status as well as hypoglycemia I would favor treating, on ceftriaxone, tailor antibiotics based on urine  cultures when finalized  Goals of care -Discussed with Christiane Ha, patient's POA over the phone, he confirms that she has been enrolled in hospice, however is asking me whether it would be beneficial for her to have a feeding tube, or beneficial to have full stroke workup.  I do not recommend a feeding tube this patient that she appears to have advanced cognitive impairment, and a stroke workup with no change current management with Eliquis.  This more likely that her mental status changes were due in the setting of profound hypoglycemia.  Family in agreement, however I did consult palliative care given the fact that they may not feel a full grasp of what hospice/end-of-life care entails    DVT prophylaxis: Eliquis Code Status: DNR Family Communication: Christiane Ha [facility]) over the phone  Disposition Plan: SNF when ready   Consultants:   Palliative care   Procedures:   None   Antimicrobials:  Ceftriaxone   Subjective: -Alert however aphasic, does not appear to be in any discomfort  Objective: Vitals:   01/02/18 1630 01/02/18 2125 01/03/18 0535 01/03/18 1346  BP: (!) 141/75 119/86 114/69 110/70  Pulse: 92 85 94 91  Resp:  12 12 14   Temp:  98.8 F (37.1 C) 98.4 F (36.9 C) 98.3 F (36.8 C)  TempSrc:  Oral Oral Oral  SpO2: 99% 100% 96% 98%  Weight:   76.6 kg (168 lb 14 oz)     Intake/Output Summary (Last 24 hours) at 01/03/2018 1620 Last data filed at 01/03/2018 0924 Gross per 24 hour  Intake 1150 ml  Output -  Net 1150 ml   [employer]  01/03/18 0535  Weight: 76.6 kg (168 lb 14 oz)    Examination:  Constitutional: Alert, tracks me with her eyes but unresponsive Eyes: No scleral icterus, lids and conjunctivae normal ENMT: Moist mucous membranes, Respiratory: clear to auscultation bilaterally, no wheezing, no crackles. Normal respiratory effort. No accessory muscle use.  Cardiovascular: Regular rate and rhythm, no murmurs / rubs / gallops. No LE edema. 2+ pedal pulses.  No carotid bruits.  Abdomen: no tenderness. Bowel sounds positive.  Skin: no rashes seen Neurologic: Appears to move all 4 extremities, does not follow commands  Data Reviewed: I have independently reviewed following labs and imaging studies   CBC: Recent Labs  Lab 01/02/18 0804 01/03/18 0344  WBC 7.5 7.1  NEUTROABS 4.5  --   HGB 15.0 12.6  HCT 47.3* 40.3  MCV 89.4 90.0  PLT 295 261   Basic Metabolic Panel: Recent Labs  Lab 01/02/18 0804 01/02/18 1832 01/03/18 0344  NA 140  --  134*  K 3.1*  --  3.0*  CL 96*  --  97*  CO2 28  --  25  GLUCOSE 54*  --  94  BUN 16  --  12  CREATININE 0.74  --  0.92  CALCIUM 9.5  --  8.3*  MG  --  1.7  --   PHOS  --  2.8  --    GFR: CrCl cannot be calculated (Unknown ideal weight.). Liver Function Tests: Recent Labs  Lab 01/03/18 0344  AST 29  ALT 19  ALKPHOS 71  BILITOT 1.4*  PROT 6.1*  ALBUMIN 3.0*   No results for input(s): LIPASE, AMYLASE in the last 168 hours. No results for input(s): AMMONIA in the last 168 hours. Coagulation Profile: No results for input(s): INR, PROTIME in the last 168 hours. Cardiac Enzymes: No results for input(s): CKTOTAL, CKMB, CKMBINDEX, TROPONINI in the last 168 hours. BNP (last 3 results) No results for input(s): PROBNP in the last 8760 hours. HbA1C: No results for input(s): HGBA1C in the last 72 hours. CBG: Recent Labs  Lab 01/02/18 1306 01/02/18 2121 01/02/18 2352 01/03/18 0413 01/03/18 1202  GLUCAP 93 94 148* 89 115*   Lipid Profile: No results for input(s): CHOL, HDL, LDLCALC, TRIG, CHOLHDL, LDLDIRECT in the last 72 hours. Thyroid Function Tests: No results for input(s): TSH, T4TOTAL, FREET4, T3FREE, THYROIDAB in the last 72 hours. Anemia Panel: No results for input(s): VITAMINB12, FOLATE, FERRITIN, TIBC, IRON, RETICCTPCT in the last 72 hours. Urine analysis:    Component Value Date/Time   COLORURINE AMBER (A) 01/02/2018 1115   APPEARANCEUR HAZY (A) 01/02/2018 1115    LABSPEC 1.021 01/02/2018 1115   PHURINE 6.0 01/02/2018 1115   GLUCOSEU 50 (A) 01/02/2018 1115   HGBUR MODERATE (A) 01/02/2018 1115   BILIRUBINUR NEGATIVE 01/02/2018 1115   KETONESUR 5 (A) 01/02/2018 1115   PROTEINUR 100 (A) 01/02/2018 1115   NITRITE NEGATIVE 01/02/2018 1115   LEUKOCYTESUR TRACE (A) 01/02/2018 1115   Sepsis Labs: Invalid input(s): PROCALCITONIN, LACTICIDVEN  Recent Results (from the past 240 hour(s))  Culture, Urine     Status: Abnormal (Preliminary result)   Collection Time: 01/02/18 11:09 AM  Result Value Ref Range Status   Specimen Description URINE, RANDOM  Final   Special Requests   Final    NONE Performed at [facility], [address], [place], [place] [address]    Culture >=100,000 COLONIES/mL ESCHERICHIA COLI (A)  Final   Report Status PENDING  Incomplete  MRSA PCR Screening  Status: None   Collection Time: 01/02/18  6:15 PM  Result Value Ref Range Status   MRSA by PCR NEGATIVE NEGATIVE Final    Comment:        The GeneXpert MRSA Assay (FDA approved for NASAL specimens only), is one component of a comprehensive MRSA colonization surveillance program. It is not intended to diagnose MRSA infection nor to guide or monitor treatment for MRSA infections. Performed at [facility], [address] [place], [place] [address]       Radiology Studies: Dg Chest 1 View  Result Date: 01/02/2018 CLINICAL DATA:  Hypoglycemia.  Abnormal breath sounds EXAM: CHEST  1 VIEW COMPARISON:  None. FINDINGS: There is minimal atelectasis in the left base. Lungs elsewhere are clear. Heart size and pulmonary vascularity are normal. No adenopathy. There is a loop recorder on the left inferiorly. IMPRESSION: Minimal left base atelectasis. No edema or consolidation. Heart size normal. Electronically Signed   By: Bretta BangWilliam  Woodruff III M.D.   On: 01/02/2018 09:22     Scheduled Meds: . apixaban  5 mg Oral BID   Continuous Infusions: . cefTRIAXone (ROCEPHIN)   IV 1 g (01/03/18 1455)  . dextrose 75 mL/hr at 01/03/18 1456     Pamella Pertostin Gherghe, MD, PhD [facility] Pager [phone]  If 7PM-7AM, please contact night-coverage [email or web address] Password [email or web address] 01/03/2018, 4:20 PM

## 2018-01-04 DIAGNOSIS — E876 Hypokalemia: Secondary | ICD-10-CM

## 2018-01-04 DIAGNOSIS — R0689 Other abnormalities of breathing: Secondary | ICD-10-CM

## 2018-01-04 DIAGNOSIS — I5042 Chronic combined systolic (congestive) and diastolic (congestive) heart failure: Secondary | ICD-10-CM

## 2018-01-04 DIAGNOSIS — R1312 Dysphagia, oropharyngeal phase: Secondary | ICD-10-CM

## 2018-01-04 DIAGNOSIS — Z515 Encounter for palliative care: Secondary | ICD-10-CM

## 2018-01-04 DIAGNOSIS — Z8673 Personal history of transient ischemic attack (TIA), and cerebral infarction without residual deficits: Secondary | ICD-10-CM

## 2018-01-04 LAB — GLUCOSE, CAPILLARY
GLUCOSE-CAPILLARY: 182 mg/dL — AB (ref 65–99)
GLUCOSE-CAPILLARY: 215 mg/dL — AB (ref 65–99)
GLUCOSE-CAPILLARY: 257 mg/dL — AB (ref 65–99)
Glucose-Capillary: 148 mg/dL — ABNORMAL HIGH (ref 65–99)
Glucose-Capillary: 215 mg/dL — ABNORMAL HIGH (ref 65–99)
Glucose-Capillary: 220 mg/dL — ABNORMAL HIGH (ref 65–99)

## 2018-01-04 LAB — URINE CULTURE

## 2018-01-04 NOTE — Progress Notes (Signed)
PROGRESS NOTE  Heather Schultz ZOX:[id number] DOB: May 21, 1946 DOA: 01/02/2018 PCP: Heather Schultz, No Pcp Per   LOS: 2 days   Brief Narrative / Interim history: 72 y.o. female with a Past Medical History of HLD, h/o CVA with resultant nonverbal status, DM, chronic combined CHF, and CAD with stents who presents with AMS and hypoglycemia.  The Heather Schultz was found unresponsive at her facility and her blood sugar by EMS was 16.  She has recently been rejecting food and medications at SNF and has been placed on Hospice  Assessment & Plan: Principal Problem:   Hypoglycemia Active Problems:   Acute hypokalemia   FTT (failure to thrive) in adult   History of CVA (cerebrovascular accident)   Dysphagia   HLD (hyperlipidemia)   CAD (coronary artery disease) w/ prior MT/stents 2016   Diabetes mellitus type 2, insulin dependent (HCC)   Chronic combined systolic and diastolic CHF (congestive heart failure) (HCC)   Hypoglycemia -Likely in the setting of insulin-dependent diabetes mellitus, Heather Schultz with poor p.o. intake over the last several months,  -Hold insulin Discontinue dextrose infusion at this morning and closely monitor CBGs  Hypokalemia -Repeat tomorrow morning  Failure to thrive in adult/dysphagia -Heather Schultz with recurrent CVAs, per Heather Schultz's POA (nephew she has had about at least 5-6 strokes in the last year or year and a half, she is currently almost nonverbal, alert but does not communicate, appears to have expressive aphasia and significant cognitive decline -Palliative care consulted, appreciate input -Speech to reevaluate today  History of CVAs -Continue full dose Eliquis but now strict n.p.o.  Chronic combined systolic and diastolic CHF -Appears euvolemic  Coronary artery disease -With prior MI, continue Eliquis and aspirin  Possible UTI -Urine cultures with E. coli which is pansensitive.  Will narrow antibiotics when she passes speech eval  Goals of care -Discussed with  Christiane HaJonathan, Heather Schultz's POA over the phone on 4/4, he confirms that she has been enrolled in hospice, however is asking me whether it would be beneficial for her to have a feeding tube, or beneficial to have full stroke workup.  I do not recommend a feeding tube this Heather Schultz that she appears to have advanced cognitive impairment, and a stroke workup will not change current management with Eliquis. -palliative following    DVT prophylaxis: Eliquis Code Status: DNR Family Communication: Christiane HaJonathan (POA) over the phone  Disposition Plan: SNF when ready   Consultants:   Palliative care   Procedures:   None   Antimicrobials:  Ceftriaxone   Subjective: -Appears somewhat improved today, she is able to articulate a 1 word answers and appears to follow intermittently some commands  Objective: Vitals:   01/03/18 1346 01/03/18 2118 01/04/18 0628 01/04/18 0839  BP: 110/70 136/75 (!) 154/83 130/78  Pulse: 91 94 96 92  Resp: 14 12 12 16   Temp: 98.3 F (36.8 C) 98.3 F (36.8 C) 98.6 F (37 C)   TempSrc: Oral     SpO2: 98% 98% 97% 98%  Weight:   75.4 kg (166 lb 3.6 oz)     Intake/Output Summary (Last 24 hours) at 01/04/2018 1253 Last data filed at 01/04/2018 0915 Gross per 24 hour  Intake 2187.5 ml  Output 1000 ml  Net 1187.5 ml   Filed Weights   01/03/18 0535 01/04/18 0628  Weight: 76.6 kg (168 lb 14 oz) 75.4 kg (166 lb 3.6 oz)    Examination:  Constitutional: No distress, she appears alert Eyes: No scleral icterus Respiratory: Clear to auscultation bilaterally without wheezing or  crackles Cardiovascular: Regular rate and rhythm without murmurs, no edema Abdomen: Soft, nontender, nondistended, bowel sounds positive Skin: No rashes  Data Reviewed: I have independently reviewed following labs and imaging studies   CBC: Recent Labs  Lab 01/02/18 0804 01/03/18 0344  WBC 7.5 7.1  NEUTROABS 4.5  --   HGB 15.0 12.6  HCT 47.3* 40.3  MCV 89.4 90.0  PLT 295 261   Basic Metabolic  Panel: Recent Labs  Lab 01/02/18 0804 01/02/18 1832 01/03/18 0344  NA 140  --  134*  K 3.1*  --  3.0*  CL 96*  --  97*  CO2 28  --  25  GLUCOSE 54*  --  94  BUN 16  --  12  CREATININE 0.74  --  0.92  CALCIUM 9.5  --  8.3*  MG  --  1.7  --   PHOS  --  2.8  --    GFR: CrCl cannot be calculated (Unknown ideal weight.). Liver Function Tests: Recent Labs  Lab 01/03/18 0344  AST 29  ALT 19  ALKPHOS 71  BILITOT 1.4*  PROT 6.1*  ALBUMIN 3.0*   No results for input(s): LIPASE, AMYLASE in the last 168 hours. No results for input(s): AMMONIA in the last 168 hours. Coagulation Profile: No results for input(s): INR, PROTIME in the last 168 hours. Cardiac Enzymes: No results for input(s): CKTOTAL, CKMB, CKMBINDEX, TROPONINI in the last 168 hours. BNP (last 3 results) No results for input(s): PROBNP in the last 8760 hours. HbA1C: No results for input(s): HGBA1C in the last 72 hours. CBG: Recent Labs  Lab 01/03/18 2024 01/04/18 0010 01/04/18 0402 01/04/18 0757 01/04/18 1215  GLUCAP 127* 148* 182* 220* 215*   Lipid Profile: No results for input(s): CHOL, HDL, LDLCALC, TRIG, CHOLHDL, LDLDIRECT in the last 72 hours. Thyroid Function Tests: No results for input(s): TSH, T4TOTAL, FREET4, T3FREE, THYROIDAB in the last 72 hours. Anemia Panel: No results for input(s): VITAMINB12, FOLATE, FERRITIN, TIBC, IRON, RETICCTPCT in the last 72 hours. Urine analysis:    Component Value Date/Time   COLORURINE AMBER (A) 01/02/2018 1115   APPEARANCEUR HAZY (A) 01/02/2018 1115   LABSPEC 1.021 01/02/2018 1115   PHURINE 6.0 01/02/2018 1115   GLUCOSEU 50 (A) 01/02/2018 1115   HGBUR MODERATE (A) 01/02/2018 1115   BILIRUBINUR NEGATIVE 01/02/2018 1115   KETONESUR 5 (A) 01/02/2018 1115   PROTEINUR 100 (A) 01/02/2018 1115   NITRITE NEGATIVE 01/02/2018 1115   LEUKOCYTESUR TRACE (A) 01/02/2018 1115   Sepsis Labs: Invalid input(s): PROCALCITONIN, LACTICIDVEN  Recent Results (from the past  240 hour(s))  Culture, Urine     Status: Abnormal   Collection Time: 01/02/18 11:09 AM  Result Value Ref Range Status   Specimen Description URINE, RANDOM  Final   Special Requests   Final    NONE Performed at [facility], [address], [place], [place] [address]    Culture >=100,000 COLONIES/mL ESCHERICHIA COLI (A)  Final   Report Status 01/04/2018 FINAL  Final   Organism ID, Bacteria ESCHERICHIA COLI (A)  Final      Susceptibility   Escherichia coli - MIC*    AMPICILLIN 8 SENSITIVE Sensitive     CEFAZOLIN 8 SENSITIVE Sensitive     CEFTRIAXONE <=1 SENSITIVE Sensitive     CIPROFLOXACIN <=0.25 SENSITIVE Sensitive     GENTAMICIN <=1 SENSITIVE Sensitive     IMIPENEM <=0.25 SENSITIVE Sensitive     NITROFURANTOIN 32 SENSITIVE Sensitive     TRIMETH/SULFA <=  20 SENSITIVE Sensitive     AMPICILLIN/SULBACTAM 8 SENSITIVE Sensitive     PIP/TAZO <=4 SENSITIVE Sensitive     Extended ESBL NEGATIVE Sensitive     * >=100,000 COLONIES/mL ESCHERICHIA COLI  MRSA PCR Screening     Status: None   Collection Time: 01/02/18  6:15 PM  Result Value Ref Range Status   MRSA by PCR NEGATIVE NEGATIVE Final    Comment:        The GeneXpert MRSA Assay (FDA approved for NASAL specimens only), is one component of a comprehensive MRSA colonization surveillance program. It is not intended to diagnose MRSA infection nor to guide or monitor treatment for MRSA infections. Performed at [facility], [address], [place], [place] [address]       Radiology Studies: No results found.   Scheduled Meds: . apixaban  5 mg Oral BID   Continuous Infusions: . cefTRIAXone (ROCEPHIN)  IV Stopped (01/04/18 0945)  . dextrose 75 mL/hr at 01/04/18 0401     Pamella Pert, MD, PhD [facility] Pager [phone]  If 7PM-7AM, please contact night-coverage [email or web address] Password [facility] 01/04/2018, 12:53 PM

## 2018-01-04 NOTE — Consult Note (Signed)
Consultation Note Date: 01/04/2018   Patient Name: Heather Schultz  DOB: 1946-04-16  MRN: [id number]  Age / Sex: 72 y.o., female  PCP: Patient, No Pcp Per Referring Physician: Caren Griffins, MD  Reason for Consultation: Establishing goals of care and Psychosocial/spiritual support  HPI/Patient Profile: 72 y.o. female  with past medical history of coronary artery disease status post stenting, hyperlipidemia, diabetes, recurrent CVA, profound dysphasia,  admitted on 01/02/2018 with altered mental status.  She was found to have had a blood glucose level of 16 in the emergency room.  Per chart review, family had already been pursuing hospice services the day before she was admitted.  Recently, patient has been not wanting to eat.  She had been on a dysphasia diet with thickened liquids in the past secondary to dysphasia  Consult ordered for goals of care.  Her nephew, Heather Schultz is listed as her healthcare POA who is reportedly arriving into town late on 01/04/2018.   Clinical Assessment and Goals of Care: Patient seen, chart reviewed.  Patient's brother-in-law is at the bedside, Mr. Heather Schultz.  Attempted to reach patient's healthcare power of attorney which is listed as her nephew, Heather Schultz.  I have left a message and attempt to set up a goals of care meeting for this weekend  Patient is alert but is nonverbal at baseline.  She is able to shake her head yes/no, most of which appears appropriate when she does answer, but she is very intermittent in her ability to answer questions.  She denies pain but is unable to tell me whether she is hungry or short of breath.  Patient is unable to speak for herself at this point.  Per chart review, Heather Schultz, her nephew, is her healthcare POA [phone]    SUMMARY OF RECOMMENDATIONS   Care limitations of DNR have been set.  We will verify this with  patient's nephew when he arrives in town hopefully over the weekend with further goals of care discussion Per chart review, family is struggling about artificial nutrition as well as further CVA workup.  We will pursue this along with other goals of care questions including hospice Have left message with Mr. Faerber Patient may still qualify for hospice in home/facility benefit with a PEG tube, given underlying diagnosis of dementia as well as recurrent CVAs, bedbound status; hospice does bolus feeds Code Status/Advance Care Planning:  DNR    Symptom Management:   Pain: Patient is currently denying pain.  Would continue with Tylenol for mild to moderate pain as needed.  Monitor for need for up titration   Psycho-social/Spiritual:   Desire for further Chaplaincy support:no  Additional Recommendations: Referral to Community Resources   Prognosis:   < 6 months in the setting of profound dysphasia, recurrent CVAs, functional decline.  Patient is at high risk for acute decompensation secondary to aspiration pneumonia, recurrent urinary tract infection, sepsis.  Family questioning PEG per chart review which could factor into  prognosis but data does not indicate that it would necessarily change  a prognosis of less than 6 months nor would eliminate aspiration  Discharge Planning: To Be Determined      Primary Diagnoses: Present on Admission: . Hypoglycemia . Acute hypokalemia . FTT (failure to thrive) in adult . HLD (hyperlipidemia) . CAD (coronary artery disease) w/ prior MT/stents 2016 . Chronic combined systolic and diastolic CHF (congestive heart failure) ([place])   I have reviewed the medical record, interviewed the patient and family, and examined the patient. The following aspects are pertinent.  Past Medical History:  Diagnosis Date  . CAD (coronary artery disease) MI w/stents 2016   . Chronic combined systolic and diastolic CHF (congestive heart failure) ([place])   .  Diabetes mellitus type 2, insulin dependent ([place])   . Dysphagia   . History of CVA (cerebrovascular accident)   . HLD (hyperlipidemia)    Social History   Socioeconomic History  . Marital status: Unknown    Spouse name: Not on file  . Number of children: Not on file  . Years of education: Not on file  . Highest education level: Not on file  Occupational History  . Not on file  Social Needs  . Financial resource strain: Not on file  . Food insecurity:    Worry: Not on file    Inability: Not on file  . Transportation needs:    Medical: Not on file    Non-medical: Not on file  Tobacco Use  . Smoking status: Never Smoker  . Smokeless tobacco: Never Used  Substance and Sexual Activity  . Alcohol use: Never    Frequency: Never  . Drug use: Never  . Sexual activity: Not Currently  Lifestyle  . Physical activity:    Days per week: 0 days    Minutes per session: 0 min  . Stress: Not on file  Relationships  . Social connections:    Talks on phone: Not on file    Gets together: Not on file    Attends religious service: Not on file    Active member of club or organization: Not on file    Attends meetings of clubs or organizations: Not on file    Relationship status: Not on file  Other Topics Concern  . Not on file  Social History Narrative   DO NOT RESUSCITATE   Family History  Family history unknown: Yes   Scheduled Meds: . apixaban  5 mg Oral BID   Continuous Infusions: . cefTRIAXone (ROCEPHIN)  IV Stopped (01/04/18 0945)  . dextrose 75 mL/hr at 01/04/18 0401   PRN Meds:.acetaminophen **OR** acetaminophen, ondansetron **OR** ondansetron (ZOFRAN) IV, RESOURCE THICKENUP CLEAR Medications Prior to Admission:  Prior to Admission medications   Medication Sig Start Date End Date Taking? Authorizing Provider  acetaminophen (TYLENOL) 325 MG tablet Take 650 mg by mouth every 4 (four) hours as needed for mild pain.   Yes [provider]  apixaban (ELIQUIS) 5 MG  TABS tablet Take 5 mg by mouth 2 (two) times daily.   Yes [provider]  aspirin 81 MG chewable tablet Chew 81 mg by mouth daily.   Yes [provider]  carvedilol (COREG) 25 MG tablet Take 25 mg by mouth 2 (two) times daily with a meal.   Yes [provider]  dextromethorphan-guaiFENesin (MUCINEX DM) 30-600 MG 12hr tablet Take 2 tablets by mouth daily as needed for cough.   Yes [provider]  furosemide (LASIX) 20 MG tablet Take 10 mg by mouth daily.   Yes [provider]  Insulin Degludec (TRESIBA FLEXTOUCH) 200 UNIT/ML SOPN Inject 30 Units into the skin daily.   Yes [provider]  mirtazapine (REMERON) 7.5 MG tablet Take 7.5 mg by mouth at bedtime.   Yes [provider]  nitroGLYCERIN (NITROSTAT) 0.4 MG SL tablet Place 0.4 mg under the tongue every 5 (five) minutes as needed for chest pain.   Yes [provider]  PARoxetine (PAXIL) 20 MG tablet Take 20 mg by mouth daily.   Yes [provider]   No Known Allergies Review of Systems  Unable to perform ROS: Patient nonverbal    Physical Exam  Constitutional: She appears well-developed and well-nourished.  Ill appearing older female; nonverbal at baseline  Neck: Normal range of motion.  Cardiovascular: Normal rate.  Pulmonary/Chest:  Weak inspiratory effort  Abdominal: Soft.  Genitourinary:  Genitourinary Comments: Patient incontinent of urine.  Pure wick in place  Neurological: She is alert.  Difficult to assess orientation.  Patient is nonverbal at  baseline secondary to a stroke She occasionally will answer questions by shaking her head, yes/no, answers appear appropriate but she does not do this consistently  Skin: Skin is warm and dry.  Alopecia  Psychiatric:  No overt agitation but otherwise unable to test  Nursing note reviewed.   Vital Signs: BP 130/78 (BP Location: Left Arm)   Pulse 92   Temp 98.6 F (37 C)   Resp 16   Wt 75.4 kg  (166 lb 3.6 oz)   SpO2 98%  Pain Scale: 0-10   Pain Score: 0-No pain   SpO2: SpO2: 98 % O2 Device:SpO2: 98 % O2 Flow Rate: .   IO: Intake/output summary:   Intake/Output Summary (Last 24 hours) at 01/04/2018 1357 Last data filed at 01/04/2018 0915 Gross per 24 hour  Intake 2187.5 ml  Output 1000 ml  Net 1187.5 ml    LBM: Last BM Date: 01/02/18 Baseline Weight: Weight: 76.6 kg (168 lb 14 oz) Most recent weight: Weight: 75.4 kg (166 lb 3.6 oz)     Palliative Assessment/Data:   Flowsheet Rows     Most Recent Value  Intake Tab  Referral Department  Hospitalist  Unit at Time of Referral  Med/Surg Unit  Palliative Care Primary Diagnosis  Neurology  Date Notified  01/03/18  Palliative Care Type  New Palliative care  Reason for referral  Clarify Goals of Care  Date of Admission  01/02/18  Date first seen by Palliative Care  01/04/18  # of days Palliative referral response time  1 Day(s)  # of days IP prior to Palliative referral  1  Clinical Assessment  Palliative Performance Scale Score  30%  Pain Max last 24 hours  Not able to report  Pain Min Last 24 hours  Not able to report  Dyspnea Max Last 24 Hours  Not able to report  Dyspnea Min Last 24 hours  Not able to report  Nausea Max Last 24 Hours  Not able to report  Nausea Min Last 24 Hours  Not able to report  Anxiety Max Last 24 Hours  Not able to report  Anxiety Min Last 24 Hours  Not able to report  Other Max Last 24 Hours  Not able to report  Psychosocial & Spiritual Assessment  Palliative Care Outcomes  Patient/Family meeting held?  Yes  Who was at the meeting?  met with pt. LM for nephew HCPOA for appt 01/05/18  Palliative Care follow-up planned  Yes, Facility      Time  In: 1330 Time Out: 1345 Time Total: 45 min Greater than 50%  of this time was spent counseling and coordinating care related to the above assessment and plan.  Signed by: Dory Horn, NP   Please contact Palliative Medicine Team  phone at [phone] for questions and concerns.  For individual provider: See Shea Evans

## 2018-01-04 NOTE — Progress Notes (Signed)
Patient alert and responding appropriately to questions with nods and gestures.  Patient attempts to assist with turns by grabbing ralings.

## 2018-01-05 LAB — GLUCOSE, CAPILLARY
Glucose-Capillary: 257 mg/dL — ABNORMAL HIGH (ref 65–99)
Glucose-Capillary: 264 mg/dL — ABNORMAL HIGH (ref 65–99)
Glucose-Capillary: 285 mg/dL — ABNORMAL HIGH (ref 65–99)
Glucose-Capillary: 267 mg/dL — ABNORMAL HIGH (ref 65–99)

## 2018-01-05 MED ORDER — METOPROLOL TARTRATE 5 MG/5ML IV SOLN
2.5000 mg | Freq: Four times a day (QID) | INTRAVENOUS | Status: DC
  Administered 2018-01-05: 2.5 mg via INTRAVENOUS
  Filled 2018-01-05: qty 5

## 2018-01-05 MED ORDER — METOPROLOL TARTRATE 5 MG/5ML IV SOLN
2.5000 mg | Freq: Four times a day (QID) | INTRAVENOUS | Status: DC
  Administered 2018-01-05 – 2018-01-08 (×11): 2.5 mg via INTRAVENOUS
  Filled 2018-01-05 (×10): qty 5

## 2018-01-05 NOTE — Progress Notes (Signed)
PROGRESS NOTE  Heather Schultz:[id number] DOB: 07-Mar-1946 DOA: 01/02/2018 PCP: Heather Schultz, No Pcp Per   LOS: 3 days   Brief Narrative / Interim history: 72 y.o. female with a Past Medical History of HLD, h/o CVA with resultant nonverbal status, DM, chronic combined CHF, and CAD with stents who presents with AMS and hypoglycemia.  The Heather Schultz was found unresponsive at her facility and her blood sugar by EMS was 16.  She has recently been rejecting food and medications at SNF and has been placed on Hospice  Assessment & Plan: Principal Problem:   Hypoglycemia Active Problems:   Acute hypokalemia   FTT (failure to thrive) in adult   History of CVA (cerebrovascular accident)   Dysphagia   HLD (hyperlipidemia)   CAD (coronary artery disease) w/ prior MT/stents 2016   Diabetes mellitus type 2, insulin dependent ([facility])   Chronic combined systolic and diastolic CHF (congestive heart failure) ([facility])   Palliative care by specialist   Hypoglycemia -Likely in the setting of insulin-dependent diabetes mellitus, Heather Schultz with poor p.o. intake over the last several months,  -Hold insulin -Back on dextrose infusion, continue  Hypokalemia -Repeat tomorrow morning  Failure to thrive in adult/dysphagia -Heather Schultz with recurrent CVAs, per Heather Schultz's POA (nephew she has had about at least 5-6 strokes in the last year or year and a half, she is currently almost nonverbal, alert but does not communicate, appears to have expressive aphasia and significant cognitive decline -Palliative care consulted, appreciate input, unable to reach family  History of CVAs -Continue full dose Eliquis but now strict n.p.o.  Chronic combined systolic and diastolic CHF -Appears euvolemic  Coronary artery disease -With prior MI, continue Eliquis and aspirin  Possible UTI -Urine cultures with E. coli which is pansensitive.  Will narrow antibiotics when she passes speech eval  Goals of care -palliative following,  unable to reach family anymore  DVT prophylaxis: Eliquis Code Status: DNR Family Communication: Heather Schultz (POA) over the phone  Disposition Plan: SNF when ready   Consultants:   Palliative care   Procedures:   None   Antimicrobials:  Ceftriaxone   Subjective: -Mainly nonverbal, no apparent distress  Objective: Vitals:   01/04/18 2050 01/05/18 0233 01/05/18 0558 01/05/18 1246  BP: 134/89  (!) 147/80 138/79  Pulse: (!) 101   (!) 119  Resp: 16  18 20   Temp: 97.7 F (36.5 C)  97.7 F (36.5 C) 98.4 F (36.9 C)  TempSrc: Oral  Oral Oral  SpO2: 99%  100% 98%  Weight:  76.8 kg (169 lb 5 oz)      Intake/Output Summary (Last 24 hours) at 01/05/2018 1303 Last data filed at 01/05/2018 0400 Gross per 24 hour  Intake 1572.5 ml  Output 1400 ml  Net 172.5 ml   Filed Weights   01/03/18 0535 01/04/18 0628 01/05/18 0233  Weight: 76.6 kg (168 lb 14 oz) 75.4 kg (166 lb 3.6 oz) 76.8 kg (169 lb 5 oz)    Examination:  Constitutional: NAD Respiratory: CTA Cardiovascular: RRR  Data Reviewed: I have independently reviewed following labs and imaging studies   CBC: Recent Labs  Lab 01/02/18 0804 01/03/18 0344  WBC 7.5 7.1  NEUTROABS 4.5  --   HGB 15.0 12.6  HCT 47.3* 40.3  MCV 89.4 90.0  PLT 295 261   Basic Metabolic Panel: Recent Labs  Lab 01/02/18 0804 01/02/18 1832 01/03/18 0344  NA 140  --  134*  K 3.1*  --  3.0*  CL 96*  --  97*  CO2 28  --  25  GLUCOSE 54*  --  94  BUN 16  --  12  CREATININE 0.74  --  0.92  CALCIUM 9.5  --  8.3*  MG  --  1.7  --   PHOS  --  2.8  --    GFR: CrCl cannot be calculated (Unknown ideal weight.). Liver Function Tests: Recent Labs  Lab 01/03/18 0344  AST 29  ALT 19  ALKPHOS 71  BILITOT 1.4*  PROT 6.1*  ALBUMIN 3.0*   No results for input(s): LIPASE, AMYLASE in the last 168 hours. No results for input(s): AMMONIA in the last 168 hours. Coagulation Profile: No results for input(s): INR, PROTIME in the last 168  hours. Cardiac Enzymes: No results for input(s): CKTOTAL, CKMB, CKMBINDEX, TROPONINI in the last 168 hours. BNP (last 3 results) No results for input(s): PROBNP in the last 8760 hours. HbA1C: No results for input(s): HGBA1C in the last 72 hours. CBG: Recent Labs  Lab 01/04/18 1215 01/04/18 1620 01/04/18 2344 01/05/18 0753 01/05/18 1225  GLUCAP 215* 257* 215* 257* 264*   Lipid Profile: No results for input(s): CHOL, HDL, LDLCALC, TRIG, CHOLHDL, LDLDIRECT in the last 72 hours. Thyroid Function Tests: No results for input(s): TSH, T4TOTAL, FREET4, T3FREE, THYROIDAB in the last 72 hours. Anemia Panel: No results for input(s): VITAMINB12, FOLATE, FERRITIN, TIBC, IRON, RETICCTPCT in the last 72 hours. Urine analysis:    Component Value Date/Time   COLORURINE AMBER (A) 01/02/2018 1115   APPEARANCEUR HAZY (A) 01/02/2018 1115   LABSPEC 1.021 01/02/2018 1115   PHURINE 6.0 01/02/2018 1115   GLUCOSEU 50 (A) 01/02/2018 1115   HGBUR MODERATE (A) 01/02/2018 1115   BILIRUBINUR NEGATIVE 01/02/2018 1115   KETONESUR 5 (A) 01/02/2018 1115   PROTEINUR 100 (A) 01/02/2018 1115   NITRITE NEGATIVE 01/02/2018 1115   LEUKOCYTESUR TRACE (A) 01/02/2018 1115   Sepsis Labs: Invalid input(s): PROCALCITONIN, LACTICIDVEN  Recent Results (from the past 240 hour(s))  Culture, Urine     Status: Abnormal   Collection Time: 01/02/18 11:09 AM  Result Value Ref Range Status   Specimen Description URINE, RANDOM  Final   Special Requests   Final    NONE Performed at [facility], [address] [place], [place] [address]    Culture >=100,000 COLONIES/mL ESCHERICHIA COLI (A)  Final   Report Status 01/04/2018 FINAL  Final   Organism ID, Bacteria ESCHERICHIA COLI (A)  Final      Susceptibility   Escherichia coli - MIC*    AMPICILLIN 8 SENSITIVE Sensitive     CEFAZOLIN 8 SENSITIVE Sensitive     CEFTRIAXONE <=1 SENSITIVE Sensitive     CIPROFLOXACIN <=0.25 SENSITIVE Sensitive     GENTAMICIN <=1  SENSITIVE Sensitive     IMIPENEM <=0.25 SENSITIVE Sensitive     NITROFURANTOIN 32 SENSITIVE Sensitive     TRIMETH/SULFA <=20 SENSITIVE Sensitive     AMPICILLIN/SULBACTAM 8 SENSITIVE Sensitive     PIP/TAZO <=4 SENSITIVE Sensitive     Extended ESBL NEGATIVE Sensitive     * >=100,000 COLONIES/mL ESCHERICHIA COLI  MRSA PCR Screening     Status: None   Collection Time: 01/02/18  6:15 PM  Result Value Ref Range Status   MRSA by PCR NEGATIVE NEGATIVE Final    Comment:        The GeneXpert MRSA Assay (FDA approved for NASAL specimens only), is one component of a comprehensive MRSA colonization surveillance program. It is not intended to  diagnose MRSA infection nor to guide or monitor treatment for MRSA infections. Performed at [facility], [address], [place], [place] [address]       Radiology Studies: No results found.   Scheduled Meds: . apixaban  5 mg Oral BID   Continuous Infusions: . cefTRIAXone (ROCEPHIN)  IV Stopped (01/05/18 0831)  . dextrose 75 mL/hr at 01/05/18 0447     Pamella Pert, MD, PhD [facility] Pager [phone]  If 7PM-7AM, please contact night-coverage [email or web address] Password [email or web address] 01/05/2018, 1:03 PM

## 2018-01-05 NOTE — Progress Notes (Signed)
Left message for nephew, HCPOA, on 4/5, as well as this am, 4/6, in an attempt to set up GOC mtg. No answer. Have also asked nursing to call when nephew arrives. No family currently at the bedside. Eduard RouxSarah Romaine Neville, ANP

## 2018-01-05 NOTE — Progress Notes (Signed)
  Speech Language Pathology Treatment: Dysphagia  Patient Details Name: Heather Schultz MRN: [id number] DOB: Mar 26, 1946 Today's Date: 01/05/2018 Time: [id number] SLP Time Calculation (min) (ACUTE ONLY): 22 min  Assessment / Plan / Recommendation Clinical Impression  Pt presents with neurological based oropharyngeal dysphagia characterized by oral holding, delayed oral propulsion/manipulation with right anterior loss and/or expulsion with ice chips and puree; tsp nectar-thick liquids with delay in the initiation of the swallow with max verbal/tactile cues to progress bolus posteriorly; pt did initiate a swallow several (2-3) times, but this was inconsistent and disorganized in nature; educated family members re: dysphagia and nutrition/hydration concerns; family in agreement with MBS to determine safest diet/liquid consistency to decrease aspiration risk and increase hydration/nutrition overall; ST will f/u for completion of MBS and subsequent goals to follow.  HPI HPI: Heather Luriahyllis Balestrieri is a 72 y.o. female who is currently DNR and on hospice effective 4/2 for multiple CVA, cardiac issues, who presents to ED for hypoglycemia.  Per EMS, patient was found unresponsive and had a blood sugar of 16.  Patient is nonverbal at baseline. She gradually began rejecting her food and medications so they placed her on hospice.  She currently resides at [facility].  Per report, all medications were discontinued with the exception of Lasix, carvedilol, Eliquis.      SLP Plan  MBS;New goals to be determined pending instrumental study       Recommendations  Diet recommendations: NPO Medication Administration: Via alternative means                Oral Care Recommendations: Oral care QID Follow up Recommendations: (Hospice?) SLP Visit Diagnosis: Dysphagia, oropharyngeal phase (R13.12) Plan: MBS;New goals to be determined pending instrumental study                      Tressie StalkerPat Zayonna Ayuso, M.S.,  CCC-SLP 01/05/2018, 3:15 PM

## 2018-01-06 LAB — CBC
HCT: 40.5 % (ref 36.0–46.0)
Hemoglobin: 13 g/dL (ref 12.0–15.0)
MCH: 29 pg (ref 26.0–34.0)
MCHC: 32.1 g/dL (ref 30.0–36.0)
MCV: 90.4 fL (ref 78.0–100.0)
PLATELETS: 261 10*3/uL (ref 150–400)
RBC: 4.48 MIL/uL (ref 3.87–5.11)
RDW: 15.3 % (ref 11.5–15.5)
WBC: 6.7 10*3/uL (ref 4.0–10.5)

## 2018-01-06 LAB — BASIC METABOLIC PANEL
ANION GAP: 15 (ref 5–15)
CALCIUM: 8.6 mg/dL — AB (ref 8.9–10.3)
CO2: 26 mmol/L (ref 22–32)
CREATININE: 0.84 mg/dL (ref 0.44–1.00)
Chloride: 93 mmol/L — ABNORMAL LOW (ref 101–111)
GFR calc Af Amer: >60 mL/min (ref >60)
GFR calc non Af Amer: >60 mL/min (ref >60)
GLUCOSE: 263 mg/dL — AB (ref 65–99)
Potassium: 3.1 mmol/L — ABNORMAL LOW (ref 3.5–5.1)
Sodium: 134 mmol/L — ABNORMAL LOW (ref 135–145)

## 2018-01-06 LAB — GLUCOSE, CAPILLARY
Glucose-Capillary: 275 mg/dL — ABNORMAL HIGH (ref 65–99)
Glucose-Capillary: 254 mg/dL — ABNORMAL HIGH (ref 65–99)
Glucose-Capillary: 258 mg/dL — ABNORMAL HIGH (ref 65–99)
Glucose-Capillary: 210 mg/dL — ABNORMAL HIGH (ref 65–99)

## 2018-01-06 NOTE — Progress Notes (Signed)
  Speech Language Pathology Treatment: Dysphagia  Patient Details Name: Heather Schultz MRN: [id number] DOB: 1946-10-01 Today's Date: 01/06/2018 Time: [id number] SLP Time Calculation (min) (ACUTE ONLY): 20 min  Assessment / Plan / Recommendation Clinical Impression  [facility] as pt appeared lethargic. Roused with mod tactile cues and repositioning. Pt with saliva puddled on right chest; cleaned and dry towel placed. No family present. Administered diagnostic trials after performing oral care. Prolonged oral holding persists, with no observable efforts to orally transit bolus. She did eventually initiate a pharyngeal swallow with max cues (verbal, tactile), with subsequent immediate anterior loss of >75% of bolus presented. This process was replicated x3, with apparent piecemealing/holding of boluses. SLP attempted several strategies to cue subsequent swallow, including thermal, tactile stimulation and dry spoon cues. All of these were ineffective and the vast majority of all boluses were ejected orally. Pt is at risk for aspiration, dehydration and malnutrition regardless of consistency due to profound oral and sensory deficits. With her present function, she is unlikely to be able to maintain adequate nutrition/hydration orally. MBS is not likely to yield significantly more information than can be gathered clinically, though may be warranted if it would impact goals of care and family's decision-making. Made efforts to contact pt's nephew and other contact listed in chart to discuss plan of care; there was no response. Discussed with RN and Dr. Elvera Schultz; will hold MBS for now until POA can be contacted. RN to page SLP if family members arrive. Recommend she remain NPO for now; if comfort feeds are desired, dysphagia 1/thin liquids would be appropriate, with thickener provided in case this improves pt's comfort with liquids.     HPI HPI: Heather Schultz is a 72  y.o. female who is currently DNR and on hospice effective 4/2 for multiple CVA, cardiac issues, who presents to ED for hypoglycemia.  Per EMS, patient was found unresponsive and had a blood sugar of 16.  Patient is nonverbal at baseline. She gradually began rejecting her food and medications so they placed her on hospice.  She [facility].  Per report, all medications were discontinued with the exception of Lasix, carvedilol, Eliquis.      SLP Plan  Continue with current plan of care;Other (Comment)(will hold MBS pending discussion with POA)       Recommendations  Diet recommendations: NPO Medication Administration: Via alternative means                Oral Care Recommendations: Oral care QID Follow up Recommendations: Other (comment)(hospice) SLP Visit Diagnosis: Dysphagia, oropharyngeal phase (R13.12) Plan: Continue with current plan of care;Other (Comment)(will hold MBS pending discussion with POA)       GO              Heather BatonMary Beth Irbin Fines, MS, CCC-SLP Speech-Language Pathologist [phone]  Heather Schultz 01/06/2018, 10:03 AM

## 2018-01-06 NOTE — Progress Notes (Addendum)
PROGRESS NOTE  Heather Schultz ZOX:[id number] DOB: 1945/10/08 DOA: 01/02/2018 PCP: Patient, No Pcp Per   LOS: 4 days   Brief Narrative / Interim history: 72 y.o. female with a Past Medical History of HLD, h/o CVA with resultant nonverbal status, DM, chronic combined CHF, and CAD with stents who presents with AMS and hypoglycemia.  The patient was found unresponsive at her facility and her blood sugar by EMS was 16.  She has recently been rejecting food and medications at SNF and has been placed on Hospice  Assessment & Plan: Principal Problem:   Hypoglycemia Active Problems:   Acute hypokalemia   FTT (failure to thrive) in adult   History of CVA (cerebrovascular accident)   Dysphagia   HLD (hyperlipidemia)   CAD (coronary artery disease) w/ prior MT/stents 2016   Diabetes mellitus type 2, insulin dependent (HCC)   Chronic combined systolic and diastolic CHF (congestive heart failure) (HCC)   Palliative care by specialist  Goals of care -palliative following -unable to reach POA for few days, messages left   Hypoglycemia -Likely in the setting of insulin-dependent diabetes mellitus, patient with poor p.o. 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  Chronic combined systolic and diastolic CHF -Appears euvolemic  Coronary artery disease -With prior MI, continue Eliquis and aspirin  Possible UTI -Urine cultures with E. coli which is pansensitive. Treat for  5 days  DVT prophylaxis: Eliquis Code Status: DNR Family Communication: Christiane Ha (POA) over the phone 4/4. Unable to reach since Disposition Plan: SNF when ready   Consultants:   Palliative care   Procedures:   None   Antimicrobials:  Ceftriaxone   Subjective: -non verbal, alert  Objective: Vitals:   01/05/18 2132 01/06/18 0500 01/06/18 0534 01/06/18 0828  BP: 137/80  130/72 132/71  Pulse: (!) 118  (!) 117 (!) 103  Resp: 14  14   Temp: 98.5 F (36.9 C)  98.3 F (36.8 C)   TempSrc: Oral  Axillary   SpO2: 98%  98%   Weight:  74.7 kg (164 lb 10.9 oz)      Intake/Output Summary (Last 24 hours) at 01/06/2018 1414 Last data filed at 01/06/2018 0929 Gross per 24 hour  Intake 1280 ml  Output 450 ml  Net 830 ml   Filed Weights   01/04/18 0628 01/05/18 0233 01/06/18 0500  Weight: 75.4 kg (166 lb 3.6 oz) 76.8 kg (169 lb 5 oz) 74.7 kg (164 lb 10.9 oz)    Examination:  Constitutional: NAD Respiratory: CTA Cardiovascular: RRR  Data Reviewed: I have independently reviewed following labs and imaging studies   CBC: Recent Labs  Lab 01/02/18 0804 01/03/18 0344 01/06/18 0228  WBC 7.5 7.1 6.7  NEUTROABS 4.5  --   --   HGB 15.0 12.6 13.0  HCT 47.3* 40.3 40.5  MCV 89.4 90.0 90.4  PLT 295 261 261   Basic Metabolic Panel: Recent Labs  Lab 01/02/18 0804 01/02/18 1832 01/03/18 0344 01/06/18 0228  NA 140  --  134* 134*  K  3.1*  --  3.0* 3.1*  CL 96*  --  97* 93*  CO2 28  --  25 26  GLUCOSE 54*  --  94 263*  BUN 16  --  12 <5*  CREATININE 0.74  --  0.92 0.84  CALCIUM 9.5  --  8.3* 8.6*  MG  --  1.7  --   --   PHOS  --  2.8  --   --    GFR: CrCl cannot be calculated (Unknown ideal weight.). Liver Function Tests: Recent Labs  Lab 01/03/18 0344  AST 29  ALT 19  ALKPHOS 71  BILITOT 1.4*  PROT 6.1*  ALBUMIN 3.0*   No results for input(s): LIPASE, AMYLASE in the last 168 hours. No results for input(s): AMMONIA in the last 168 hours. Coagulation  Profile: No results for input(s): INR, PROTIME in the last 168 hours. Cardiac Enzymes: No results for input(s): CKTOTAL, CKMB, CKMBINDEX, TROPONINI in the last 168 hours. BNP (last 3 results) No results for input(s): PROBNP in the last 8760 hours. HbA1C: No results for input(s): HGBA1C in the last 72 hours. CBG: Recent Labs  Lab 01/05/18 1225 01/05/18 1617 01/05/18 2007 01/06/18 0753 01/06/18 1209  GLUCAP 264* 285* 267* 275* 254*   Lipid Profile: No results for input(s): CHOL, HDL, LDLCALC, TRIG, CHOLHDL, LDLDIRECT in the last 72 hours. Thyroid Function Tests: No results for input(s): TSH, T4TOTAL, FREET4, T3FREE, THYROIDAB in the last 72 hours. Anemia Panel: No results for input(s): VITAMINB12, FOLATE, FERRITIN, TIBC, IRON, RETICCTPCT in the last 72 hours. Urine analysis:    Component Value Date/Time   COLORURINE AMBER (A) 01/02/2018 1115   APPEARANCEUR HAZY (A) 01/02/2018 1115   LABSPEC 1.021 01/02/2018 1115   PHURINE 6.0 01/02/2018 1115   GLUCOSEU 50 (A) 01/02/2018 1115   HGBUR MODERATE (A) 01/02/2018 1115   BILIRUBINUR NEGATIVE 01/02/2018 1115   KETONESUR 5 (A) 01/02/2018 1115   PROTEINUR 100 (A) 01/02/2018 1115   NITRITE NEGATIVE 01/02/2018 1115   LEUKOCYTESUR TRACE (A) 01/02/2018 1115   Sepsis Labs: Invalid input(s): PROCALCITONIN, LACTICIDVEN  Recent Results (from the past 240 hour(s))  Culture, Urine     Status: Abnormal   Collection Time: 01/02/18 11:09 AM  Result Value Ref Range Status   Specimen Description URINE, RANDOM  Final   Special Requests   Final    NONE Performed at [facility], [address], [place], [place] [address]    Culture >=100,000 COLONIES/mL ESCHERICHIA COLI (A)  Final   Report Status 01/04/2018 FINAL  Final   Organism ID, Bacteria ESCHERICHIA COLI (A)  Final      Susceptibility   Escherichia coli - MIC*    AMPICILLIN 8 SENSITIVE Sensitive     CEFAZOLIN 8 SENSITIVE Sensitive     CEFTRIAXONE <=1 SENSITIVE Sensitive      CIPROFLOXACIN <=0.25 SENSITIVE Sensitive     GENTAMICIN <=1 SENSITIVE Sensitive     IMIPENEM <=0.25 SENSITIVE Sensitive     NITROFURANTOIN 32 SENSITIVE Sensitive     TRIMETH/SULFA <=20 SENSITIVE Sensitive     AMPICILLIN/SULBACTAM 8 SENSITIVE Sensitive     PIP/TAZO <=4 SENSITIVE Sensitive     Extended ESBL NEGATIVE Sensitive     * >=100,000 COLONIES/mL ESCHERICHIA COLI  MRSA PCR Screening     Status: None   Collection Time: 01/02/18  6:15 PM  Result Value Ref Range Status   MRSA by PCR NEGATIVE NEGATIVE Final    Comment:        The  GeneXpert MRSA Assay (FDA approved for NASAL specimens only), is one component of a comprehensive MRSA colonization surveillance program. It is not intended to diagnose MRSA infection nor to guide or monitor treatment for MRSA infections. Performed at [facility], [address] [place], [place] [address]       Radiology Studies: No results found.   Scheduled Meds: . apixaban  5 mg Oral BID  . metoprolol tartrate  2.5 mg Intravenous Q6H   Continuous Infusions: . cefTRIAXone (ROCEPHIN)  IV Stopped (01/06/18 0854)  . dextrose 40 mL/hr at 01/06/18 1101     Pamella Pertostin Gherghe, MD, PhD [facility] Pager [phone]  If 7PM-7AM, please contact night-coverage [email or web address] Password [email or web address] 01/06/2018, 2:13 PM

## 2018-01-07 LAB — GLUCOSE, CAPILLARY
GLUCOSE-CAPILLARY: 227 mg/dL — AB (ref 65–99)
GLUCOSE-CAPILLARY: 176 mg/dL — AB (ref 65–99)
GLUCOSE-CAPILLARY: 192 mg/dL — AB (ref 65–99)
GLUCOSE-CAPILLARY: 155 mg/dL — AB (ref 65–99)
Glucose-Capillary: 175 mg/dL — ABNORMAL HIGH (ref 65–99)

## 2018-01-07 NOTE — Progress Notes (Signed)
PROGRESS NOTE  Heather Schultz ZOX:[id number] DOB: 02/01/1946 DOA: 01/02/2018 PCP: Patient, No Pcp Per   LOS: 5 days   Brief Narrative / Interim history: 72 y.o. female with a Past Medical History of HLD, h/o CVA with resultant nonverbal status, DM, chronic combined CHF, and CAD with stents who presents with AMS and hypoglycemia.  The patient was found unresponsive at her facility and her blood sugar by EMS was 16.  She has recently been rejecting food and medications at SNF and has been placed on Hospice  Assessment & Plan: Principal Problem:   Hypoglycemia Active Problems:   Acute hypokalemia   FTT (failure to thrive) in adult   History of CVA (cerebrovascular accident)   Dysphagia   HLD (hyperlipidemia)   CAD (coronary artery disease) w/ prior MT/stents 2016   Diabetes mellitus type 2, insulin dependent (HCC)   Chronic combined systolic and diastolic CHF (congestive heart failure) (HCC)   Palliative care by specialist  Goals of care -palliative following -meeting with POA tomorrow   Hypoglycemia -Likely in the setting of insulin-dependent diabetes mellitus, patient with poor p.o. 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  Chronic combined systolic and diastolic CHF -Appears euvolemic  Coronary artery disease -With prior MI, continue Eliquis and aspirin  Possible UTI -Urine cultures with E. coli which is pansensitive. Finished a 5 day course   DVT prophylaxis: Eliquis Code Status: DNR Family Communication: Heather Schultz (POA) over  the phone 4/4 and 4/8. Disposition Plan: SNF when ready   Consultants:   Palliative care   Procedures:   None   Antimicrobials:  Ceftriaxone   Subjective: -non verbal, alert  Objective: Vitals:   01/06/18 2151 01/07/18 0430 01/07/18 0556 01/07/18 1320  BP: 129/68  112/69 119/71  Pulse: (!) 117  (!) 106 (!) 108  Resp: 15  16 18   Temp: 99.1 F (37.3 C)  100 F (37.8 C) 98.1 F (36.7 C)  TempSrc: Oral  Oral Oral  SpO2: 100%  98% 99%  Weight:  77 kg (169 lb 12.1 oz)      Intake/Output Summary (Last 24 hours) at 01/07/2018 1339 Last data filed at 01/07/2018 1300 Gross per 24 hour  Intake 1665.58 ml  Output 1200 ml  Net 465.58 ml   Filed Weights   01/05/18 0233 01/06/18 0500 01/07/18 0430  Weight: 76.8 kg (169 lb 5 oz) 74.7 kg (164 lb 10.9 oz) 77 kg (169 lb 12.1 oz)    Examination:  Constitutional: NAD Respiratory: CTA Cardiovascular: RRR  Data Reviewed: I have independently reviewed following labs and imaging studies   CBC: Recent Labs  Lab 01/02/18 0804 01/03/18 0344 01/06/18 0228  WBC 7.5 7.1 6.7  NEUTROABS 4.5  --   --   HGB 15.0 12.6 13.0  HCT 47.3* 40.3 40.5  MCV 89.4 90.0 90.4  PLT 295 261 261   Basic Metabolic Panel: Recent Labs  Lab 01/02/18 0804 01/02/18 1832 01/03/18 0344 01/06/18 0228  NA 140  --  134* 134*  K 3.1*  --  3.0* 3.1*  CL 96*  --  97*  93*  CO2 28  --  25 26  GLUCOSE 54*  --  94 263*  BUN 16  --  12 <5*  CREATININE 0.74  --  0.92 0.84  CALCIUM 9.5  --  8.3* 8.6*  MG  --  1.7  --   --   PHOS  --  2.8  --   --    GFR: CrCl cannot be calculated (Unknown ideal weight.). Liver Function Tests: Recent Labs  Lab 01/03/18 0344  AST 29  ALT 19  ALKPHOS 71  BILITOT 1.4*  PROT 6.1*  ALBUMIN 3.0*   No results for input(s): LIPASE, AMYLASE in the last 168 hours. No results for input(s): AMMONIA in the last 168 hours. Coagulation Profile: No results for input(s): INR, PROTIME in the last 168 hours. Cardiac Enzymes: No  results for input(s): CKTOTAL, CKMB, CKMBINDEX, TROPONINI in the last 168 hours. BNP (last 3 results) No results for input(s): PROBNP in the last 8760 hours. HbA1C: No results for input(s): HGBA1C in the last 72 hours. CBG: Recent Labs  Lab 01/06/18 1630 01/06/18 2154 01/07/18 0437 01/07/18 0755 01/07/18 1104  GLUCAP 258* 210* 227* 176* 192*   Lipid Profile: No results for input(s): CHOL, HDL, LDLCALC, TRIG, CHOLHDL, LDLDIRECT in the last 72 hours. Thyroid Function Tests: No results for input(s): TSH, T4TOTAL, FREET4, T3FREE, THYROIDAB in the last 72 hours. Anemia Panel: No results for input(s): VITAMINB12, FOLATE, FERRITIN, TIBC, IRON, RETICCTPCT in the last 72 hours. Urine analysis:    Component Value Date/Time   COLORURINE AMBER (A) 01/02/2018 1115   APPEARANCEUR HAZY (A) 01/02/2018 1115   LABSPEC 1.021 01/02/2018 1115   PHURINE 6.0 01/02/2018 1115   GLUCOSEU 50 (A) 01/02/2018 1115   HGBUR MODERATE (A) 01/02/2018 1115   BILIRUBINUR NEGATIVE 01/02/2018 1115   KETONESUR 5 (A) 01/02/2018 1115   PROTEINUR 100 (A) 01/02/2018 1115   NITRITE NEGATIVE 01/02/2018 1115   LEUKOCYTESUR TRACE (A) 01/02/2018 1115   Sepsis Labs: Invalid input(s): PROCALCITONIN, LACTICIDVEN  Recent Results (from the past 240 hour(s))  Culture, Urine     Status: Abnormal   Collection Time: 01/02/18 11:09 AM  Result Value Ref Range Status   Specimen Description URINE, RANDOM  Final   Special Requests   Final    NONE Performed at [facility], [address] [place], [place] [address]    Culture >=100,000 COLONIES/mL ESCHERICHIA COLI (A)  Final   Report Status 01/04/2018 FINAL  Final   Organism ID, Bacteria ESCHERICHIA COLI (A)  Final      Susceptibility   Escherichia coli - MIC*    AMPICILLIN 8 SENSITIVE Sensitive     CEFAZOLIN 8 SENSITIVE Sensitive     CEFTRIAXONE <=1 SENSITIVE Sensitive     CIPROFLOXACIN <=0.25 SENSITIVE Sensitive     GENTAMICIN <=1 SENSITIVE Sensitive     IMIPENEM  <=0.25 SENSITIVE Sensitive     NITROFURANTOIN 32 SENSITIVE Sensitive     TRIMETH/SULFA <=20 SENSITIVE Sensitive     AMPICILLIN/SULBACTAM 8 SENSITIVE Sensitive     PIP/TAZO <=4 SENSITIVE Sensitive     Extended ESBL NEGATIVE Sensitive     * >=100,000 COLONIES/mL ESCHERICHIA COLI  MRSA PCR Screening     Status: None   Collection Time: 01/02/18  6:15 PM  Result Value Ref Range Status   MRSA by PCR NEGATIVE NEGATIVE Final    Comment:        The GeneXpert MRSA Assay (FDA approved for NASAL specimens only), is one component of  a comprehensive MRSA colonization surveillance program. It is not intended to diagnose MRSA infection nor to guide or monitor treatment for MRSA infections. Performed at [facility], [address] [place], [place] [address]       Radiology Studies: No results found.   Scheduled Meds: . apixaban  5 mg Oral BID  . metoprolol tartrate  2.5 mg Intravenous Q6H   Continuous Infusions: . cefTRIAXone (ROCEPHIN)  IV 1 g (01/07/18 1332)  . dextrose Stopped (01/07/18 0802)     Pamella Pertostin Alexandrea Westergard, MD, PhD [facility] Pager [phone]  If 7PM-7AM, please contact night-coverage [email or web address] Password [email or web address] 01/07/2018, 1:39 PM

## 2018-01-07 NOTE — Plan of Care (Signed)
Continue to turn every 2 hours, heels elevated off bed.  IV infiltrated to right  upper arm, discussed with Dr. Elvera LennoxGherghe D10 infusion.  Will continue to monitor blood sugar every 4 hours.  Patient able to nod to questions, when asked if she would like to eat, she responded by nodding her head "no"  when asked patient if she wanted to continue receiving IVF for her blood sugars she again nodded her head "no", when asked were you a nurse pt nodded head "yes".  Plans for discussion with POA on 4/9.

## 2018-01-07 NOTE — Discharge Instructions (Signed)

## 2018-01-07 NOTE — Progress Notes (Signed)
SLP Cancellation Note  Patient Details Name: Heather Schultz MRN: [id number] DOB: 03/21/1946   Cancelled treatment:       Reason Eval/Treat Not Completed: Other (comment)(No family present at this time. Will continue efforts.)  Heather Schultz, [facility], CCC-SLP Speech Language Pathologist [phone]  Heather Schultz, Heather Schultz 01/07/2018, 9:31 AM

## 2018-01-07 NOTE — Care Management Important Message (Signed)
Important Message  Patient Details  Name: Heather Schultz MRN: [id number] Date of Birth: May 16, 1946   Medicare Important Message Given:     Due to illness patient not able to sign/Unsigned copy left at patient bedside Dorena BodoIris Gredmarie Delange 01/07/2018, 2:05 PM

## 2018-01-08 DIAGNOSIS — E162 Hypoglycemia, unspecified: Secondary | ICD-10-CM

## 2018-01-08 DIAGNOSIS — R627 Adult failure to thrive: Secondary | ICD-10-CM

## 2018-01-08 LAB — GLUCOSE, CAPILLARY
GLUCOSE-CAPILLARY: 123 mg/dL — AB (ref 65–99)
GLUCOSE-CAPILLARY: 128 mg/dL — AB (ref 65–99)
GLUCOSE-CAPILLARY: 152 mg/dL — AB (ref 65–99)
Glucose-Capillary: 118 mg/dL — ABNORMAL HIGH (ref 65–99)

## 2018-01-08 MED ORDER — BIOTENE DRY MOUTH MT LIQD: 15.0000 mL | OROMUCOSAL | Status: DC | PRN

## 2018-01-08 MED ORDER — GLYCOPYRROLATE 1 MG PO TABS: 1.0000 mg | ORAL_TABLET | ORAL | Status: DC | PRN

## 2018-01-08 MED ORDER — GLYCOPYRROLATE 0.2 MG/ML IJ SOLN: 0.2000 mg | INTRAMUSCULAR | Status: DC | PRN

## 2018-01-08 MED ORDER — MORPHINE SULFATE (CONCENTRATE) 10 MG/0.5ML PO SOLN
5.0000 mg | ORAL | Status: DC | PRN
  Filled 2018-01-08: qty 0.5

## 2018-01-08 MED ORDER — POLYVINYL ALCOHOL 1.4 % OP SOLN
1.0000 [drp] | Freq: Four times a day (QID) | OPHTHALMIC | Status: DC | PRN
  Filled 2018-01-08: qty 15

## 2018-01-08 MED ORDER — SODIUM CHLORIDE 0.9 % IV SOLN: INTRAVENOUS | Status: DC

## 2018-01-08 MED ORDER — HALOPERIDOL LACTATE 5 MG/ML IJ SOLN: 0.5000 mg | INTRAMUSCULAR | Status: DC | PRN

## 2018-01-08 MED ORDER — HALOPERIDOL 0.5 MG PO TABS
0.5000 mg | ORAL_TABLET | ORAL | Status: DC | PRN
  Filled 2018-01-08: qty 1

## 2018-01-08 MED ORDER — HALOPERIDOL LACTATE 2 MG/ML PO CONC
0.5000 mg | ORAL | Status: DC | PRN
  Filled 2018-01-08: qty 0.3

## 2018-01-08 MED ORDER — MORPHINE SULFATE (CONCENTRATE) 10 MG/0.5ML PO SOLN
5.0000 mg | ORAL | Status: DC | PRN
  Administered 2018-01-08: 5 mg via ORAL

## 2018-01-08 NOTE — Progress Notes (Signed)
[facility] has arrived and Heather Schultz called to make aware.

## 2018-01-08 NOTE — Progress Notes (Signed)
SLP Cancellation Note  Patient Details Name: Heather Schultz MRN: [id number] DOB: 19-May-1946   Cancelled treatment:       Reason Eval/Treat Not Completed: Other (comment)(meeting with HCPOA today. Will follow up after GOC established.)  Kylee Nardozzi B. Murvin NatalBueche, [facility]P, CCC-SLP Speech Language Pathologist [phone]  Leigh AuroraBueche, Valita Righter Brown 01/08/2018, 10:13 AM

## 2018-01-08 NOTE — Progress Notes (Signed)
Report called to Okey Regalarol at hospice home. Patient prep and ready to go. Patient IV removed and patient has stage II wound to sacral. Will notify nephew Christiane HaJonathan when EMS arrives.

## 2018-01-08 NOTE — Progress Notes (Signed)
Nutrition Brief Note  Chart reviewed. Pt now transitioning to comfort care.  No further nutrition interventions warranted at this time.  Please re-consult as needed.   Cathey Fredenburg M. Jozey Janco, MS, RD LDN Inpatient Clinical Dietitian Pager [phone]    

## 2018-01-08 NOTE — Progress Notes (Addendum)
4:40pm-Patient's nephew Youth worker(HCPOA) reports agreement with discharge to Hospice and is completing [facility]. He has noticed a decline with the patient over the last few months and wants her to be comfortable.   3:59pm-CSW received [facility]. They are able to accept patient as soon as paperwork is signed with HCPOA.   Osborne Cascoadia Terilyn Sano LCSW [phone]

## 2018-01-08 NOTE — Discharge Summary (Signed)
Physician Discharge Summary  Crista Luriahyllis Cush ZOX:[id number] DOB: Dec 30, 1945 DOA: 01/02/2018  PCP: Patient, No Pcp Per  Admit date: 01/02/2018 Discharge date: 01/08/2018  Admitted From: SNF Disposition:  Residential hospice  Recommendations for Outpatient Follow-up:  Patient to be discharged to residential hospice  Discharge Condition: Guarded CODE STATUS: DNR Diet recommendation: Comfort feeding  HPI: Per Dr. Ophelia CharterYates, Crista Luriahyllis Sandstrom is a 72 y.o. female with a Past Medical History of HLD, h/o CVA with resultant nonverbal status, DM, chronic combined CHF, and CAD with stents who presents with AMS and hypoglycemia.  The patient was found unresponsive at her facility and her blood sugar by EMS was 16.  She has recently been rejecting food and medications at SNF and has been placed on Hospice.  At the time of my evaluation, the patient was alert and able to follow commands but nonverbal.  It is not clear if this is her baseline as there was no one else present.  Hospital Course: Hypoglycemia -agent was admitted to the hospital with hypoglycemia, she has a history of insulin-dependent diabetes mellitus however due to progression of her mental status she has completely stopped eating, still receiving insulin and therefore became hypoglycemic.  She was placed on dextrose infusion, her sugars stabilized no longer requiring IV dextrose.  Insulin is on hold as she continues to have no p.o. intake Failure to thrive in adult / dysphagia / prior multiple CVAs / Goals of care -patient has had recurrent CVAs in the past, currently is bedbound and nonverbal.  Hospice was initiated at SNF, however given the fact that is not eating or drinking anything right now going back to SNF is not appropriate and life expectancy less than 2 weeks.  Palliative care was consulted and followed patient, after discussing with the POA decision was made for patient to be transitioned to full comfort, she would be transferred to  residential hospice Chronic combined systolic and diastolic CHF  Coronary artery disease -With prior MIs Possible UTI -Urine cultures with E. coli which is pansensitive. Finished a 5 day course    Discharge Diagnoses:  Principal Problem:   Hypoglycemia Active Problems:   Acute hypokalemia   FTT (failure to thrive) in adult   History of CVA (cerebrovascular accident)   Dysphagia   HLD (hyperlipidemia)   CAD (coronary artery disease) w/ prior MT/stents 2016   Diabetes mellitus type 2, insulin dependent (HCC)   Chronic combined systolic and diastolic CHF (congestive heart failure) (HCC)   Palliative care by specialist   Discharge Instructions   Allergies as of 01/08/2018   No Known Allergies     Medication List    STOP taking these medications   acetaminophen 325 MG tablet Commonly known as:  TYLENOL   aspirin 81 MG chewable tablet   carvedilol 25 MG tablet Commonly known as:  COREG   dextromethorphan-guaiFENesin 30-600 MG 12hr tablet Commonly known as:  MUCINEX DM   ELIQUIS 5 MG Tabs tablet Generic drug:  apixaban   furosemide 20 MG tablet Commonly known as:  LASIX   mirtazapine 7.5 MG tablet Commonly known as:  REMERON   nitroGLYCERIN 0.4 MG SL tablet Commonly known as:  NITROSTAT   PARoxetine 20 MG tablet Commonly known as:  PAXIL   TRESIBA FLEXTOUCH 200 UNIT/ML Sopn Generic drug:  Insulin Degludec        Consultations:  Palliative care  Procedures/Studies:  Dg Chest 1 View  Result Date: 01/02/2018 CLINICAL DATA:  Hypoglycemia.  Abnormal breath sounds EXAM: CHEST  1 VIEW COMPARISON:  None. FINDINGS: There is minimal atelectasis in the left base. Lungs elsewhere are clear. Heart size and pulmonary vascularity are normal. No adenopathy. There is a loop recorder on the left inferiorly. IMPRESSION: Minimal left base atelectasis. No edema or consolidation. Heart size normal. Electronically Signed   By: Bretta Bang III M.D.   On: 01/02/2018 09:22       Subjective: Non verbal  Discharge Exam: Vitals:   01/08/18 0900 01/08/18 1345  BP: 116/77 (!) 115/58  Pulse: (!) 101 (!) 103  Resp:  19  Temp:  98.4 F (36.9 C)  SpO2:  100%    General: non verbal, eyes open Cardiovascular: RRR, S1/S2 +, no rubs, no gallops Respiratory: CTA bilaterally, no wheezing, no rhonchi Abdominal: Soft, NT, ND, bowel sounds + Extremities: mottled extremities    The results of significant diagnostics from this hospitalization (including imaging, microbiology, ancillary and laboratory) are listed below for reference.     Microbiology: Recent Results (from the past 240 hour(s))  Culture, Urine     Status: Abnormal   Collection Time: 01/02/18 11:09 AM  Result Value Ref Range Status   Specimen Description URINE, RANDOM  Final   Special Requests   Final    NONE Performed at [facility], [address] [place], [place] [address]    Culture >=100,000 COLONIES/mL ESCHERICHIA COLI (A)  Final   Report Status 01/04/2018 FINAL  Final   Organism ID, Bacteria ESCHERICHIA COLI (A)  Final      Susceptibility   Escherichia coli - MIC*    AMPICILLIN 8 SENSITIVE Sensitive     CEFAZOLIN 8 SENSITIVE Sensitive     CEFTRIAXONE <=1 SENSITIVE Sensitive     CIPROFLOXACIN <=0.25 SENSITIVE Sensitive     GENTAMICIN <=1 SENSITIVE Sensitive     IMIPENEM <=0.25 SENSITIVE Sensitive     NITROFURANTOIN 32 SENSITIVE Sensitive     TRIMETH/SULFA <=20 SENSITIVE Sensitive     AMPICILLIN/SULBACTAM 8 SENSITIVE Sensitive     PIP/TAZO <=4 SENSITIVE Sensitive     Extended ESBL NEGATIVE Sensitive     * >=100,000 COLONIES/mL ESCHERICHIA COLI  MRSA PCR Screening     Status: None   Collection Time: 01/02/18  6:15 PM  Result Value Ref Range Status   MRSA by PCR NEGATIVE NEGATIVE Final    Comment:        The GeneXpert MRSA Assay (FDA approved for NASAL specimens only), is one component of a comprehensive MRSA colonization surveillance program. It is not intended to  diagnose MRSA infection nor to guide or monitor treatment for MRSA infections. Performed at [facility], [address] [place], [place] [address]      Labs: BNP (last 3 results) No results for input(s): BNP in the last 8760 hours. Basic Metabolic Panel: Recent Labs  Lab 01/02/18 0804 01/02/18 1832 01/03/18 0344 01/06/18 0228  NA 140  --  134* 134*  K 3.1*  --  3.0* 3.1*  CL 96*  --  97* 93*  CO2 28  --  25 26  GLUCOSE 54*  --  94 263*  BUN 16  --  12 <5*  CREATININE 0.74  --  0.92 0.84  CALCIUM 9.5  --  8.3* 8.6*  MG  --  1.7  --   --   PHOS  --  2.8  --   --    Liver Function Tests: Recent Labs  Lab 01/03/18 0344  AST 29  ALT 19  ALKPHOS 71  BILITOT 1.4*  PROT 6.1*  ALBUMIN 3.0*   No results for input(s): LIPASE, AMYLASE in the last 168 hours. No results for input(s): AMMONIA in the last 168 hours. CBC: Recent Labs  Lab 01/02/18 0804 01/03/18 0344 01/06/18 0228  WBC 7.5 7.1 6.7  NEUTROABS 4.5  --   --   HGB 15.0 12.6 13.0  HCT 47.3* 40.3 40.5  MCV 89.4 90.0 90.4  PLT 295 261 261   Cardiac Enzymes: No results for input(s): CKTOTAL, CKMB, CKMBINDEX, TROPONINI in the last 168 hours. BNP: Invalid input(s): POCBNP CBG: Recent Labs  Lab 01/07/18 1651 01/07/18 2052 01/07/18 2346 01/08/18 0752 01/08/18 1211  GLUCAP 175* 155* 152* 123* 128*   D-Dimer No results for input(s): DDIMER in the last 72 hours. Hgb A1c No results for input(s): HGBA1C in the last 72 hours. Lipid Profile No results for input(s): CHOL, HDL, LDLCALC, TRIG, CHOLHDL, LDLDIRECT in the last 72 hours. Thyroid function studies No results for input(s): TSH, T4TOTAL, T3FREE, THYROIDAB in the last 72 hours.  Invalid input(s): FREET3 Anemia work up No results for input(s): VITAMINB12, FOLATE, FERRITIN, TIBC, IRON, RETICCTPCT in the last 72 hours. Urinalysis    Component Value Date/Time   COLORURINE AMBER (A) 01/02/2018 1115   APPEARANCEUR HAZY (A) 01/02/2018 1115    LABSPEC 1.021 01/02/2018 1115   PHURINE 6.0 01/02/2018 1115   GLUCOSEU 50 (A) 01/02/2018 1115   HGBUR MODERATE (A) 01/02/2018 1115   BILIRUBINUR NEGATIVE 01/02/2018 1115   KETONESUR 5 (A) 01/02/2018 1115   PROTEINUR 100 (A) 01/02/2018 1115   NITRITE NEGATIVE 01/02/2018 1115   LEUKOCYTESUR TRACE (A) 01/02/2018 1115   Sepsis Labs Invalid input(s): PROCALCITONIN,  WBC,  LACTICIDVEN   Time coordinating discharge: 45 minutes  SIGNED:  Pamella Pert, MD  [facility] 01/08/2018, 4:00 PM Pager [phone]  If 7PM-7AM, please contact night-coverage [email or web address] Password [email or web address]

## 2018-01-08 NOTE — Progress Notes (Signed)
[facility] Anticipated DC date: 01/08/18 Family notified: Nephew Transport by: Sharin MonsPTAR   Per MD patient ready for DC to Hospice. RN, patient, patient's family, and facility notified of DC. Discharge Summary sent to facility. RN given number for report. DC packet on chart. Ambulance transport requested for patient.   CSW signing off.  Cristobal GoldmannNadia Taneesha Edgin, LCSW Clinical Social Worker [phone]

## 2018-01-08 NOTE — Progress Notes (Signed)
                                                                                                                                                                                                         Daily Progress Note   Patient Name: Heather Schultz       Date: 01/08/2018 DOB: 06/22/1946  Age: 72 y.o. MRN#: [id number] Attending Physician: Gherghe, Costin M, MD Primary Care Physician: Patient, No Pcp Per Admit Date: 01/02/2018  Reason for Consultation/Follow-up: Establishing goals of care  Subjective: Patient is non-verbal but will nod yes or no to some questions.  She smiles at me.  In talking with the patient's bedside RN - the patient is still not eating or drinking.  Her feet are cold and mottling.  Met sister Heather Schultz and brother in law Heather Schultz at bedside with Dr. Gherghe.  They understand that she is at end of life and are agreeable to Hospice House.  They defer to Heather Schultz who is the HCPOA to make any decisions.    I spoke with her nephew Heather Schultz (HCPOA) on the phone.  He is not surprised when I explain that his aunt is near end of life.  He expresses that he has seen her decline particularly over the last two months and she is just not getting better.   I explained that artificially feeding her at this point would not benefit her and would simply make her uncomfortable.  We discussed Hospice House.  Heather Schultz would like for his Aunt to have expert end of life care from Hospice rather than returning to the nursing home.   Assessment: 72 yo female with a series of CVAs.  No longer eating or drinking.  Bedbound, nonverbal.  Appropriate for Hospice House   Patient Profile/HPI:  72 y.o. female  with past medical history of coronary artery disease status post stenting, hyperlipidemia, diabetes, recurrent CVA, dysphasia, admitted on 01/02/2018 with altered mental status.  She was found to have had a blood glucose level of 16 in the emergency room.  Per chart review, family had already been  pursuing hospice services the day before she was admitted.  Recently, patient has been not wanting to eat.  She had been on a dysphasia diet with thickened liquids in the past secondary to dysphasia.  She was treated for UTI and maintained on D10 infusion as she will not eat or drink.  Length of Stay: 6  Current Medications: Scheduled Meds:  . apixaban  5 mg Oral BID  . metoprolol tartrate  2.5 mg Intravenous Q6H      Continuous Infusions:   PRN Meds: acetaminophen **OR** acetaminophen, ondansetron **OR** ondansetron (ZOFRAN) IV, RESOURCE THICKENUP CLEAR  Physical Exam        Elderly non-verbal female.  Pleasant expression, NAD.  Under numerous blankets. CV rrr resp no distress Abdomen soft, nt,nd Lower extremities are bilaterally cold and tender to touch.  Mottling present.  Vital Signs: BP 129/76 (BP Location: Left Arm)   Pulse (!) 105   Temp 97.9 F (36.6 C) (Oral)   Resp 18   Wt 77 kg (169 lb 12.1 oz)   SpO2 100%  SpO2: SpO2: 100 % O2 Device: O2 Device: Room Air O2 Flow Rate:    Intake/output summary:   Intake/Output Summary (Last 24 hours) at 01/08/2018 0839 Last data filed at 01/07/2018 1500 Gross per 24 hour  Intake 100 ml  Output 150 ml  Net -50 ml   LBM: Last BM Date: 01/02/18 Baseline Weight: Weight: 76.6 kg (168 lb 14 oz) Most recent weight: Weight: 77 kg (169 lb 12.1 oz)       Palliative Assessment/Data:  10%   Patient Active Problem List   Diagnosis Date Noted  . Palliative care by specialist   . Hypoglycemia 01/02/2018  . Acute hypokalemia 01/02/2018  . FTT (failure to thrive) in adult 01/02/2018  . History of CVA (cerebrovascular accident) 01/02/2018  . Dysphagia 01/02/2018  . HLD (hyperlipidemia) 01/02/2018  . CAD (coronary artery disease) w/ prior MT/stents 2016 01/02/2018  . Diabetes mellitus type 2, insulin dependent ([place]) 01/02/2018  . Chronic combined systolic and diastolic CHF (congestive heart failure) ([place]) 01/02/2018    Palliative  Care Plan    Recommendations/Plan:  D/C to [place]   Shift to comfort measures only status.  Goals of Care and Additional Recommendations:  Limitations on Scope of Treatment: Full Comfort Care  Code Status:  DNR  Prognosis:   < 2 weeks patient refusing all PO intake, bedbound, nonverbal, mixed heart failure, multiple strokes.   Discharge Planning:  Hospice facility  Care plan was discussed with Dr. Cruzita Lederer, bedside RN  Thank you for allowing the Palliative Medicine Team to assist in the care of this patient.  Total time spent:  35 min.     Greater than 50%  of this time was spent counseling and coordinating care related to the above assessment and plan.  Florentina Jenny, PA-C Palliative Medicine  Please contact Palliative MedicineTeam phone at [phone] for questions and concerns between 7 am - 7 pm.   Please see AMION for individual provider pager numbers.

## 2018-01-30 DEATH — deceased

## 2019-05-21 ENCOUNTER — Other Ambulatory Visit: Payer: Self-pay

## 2019-07-08 IMAGING — DX DG CHEST 1V
1 series · 1 of 1 positions shown · non-contrast
Comparison: None.

CLINICAL DATA: Hypoglycemia.  Abnormal breath sounds

EXAM:
CHEST  1 VIEW

[chest pa]
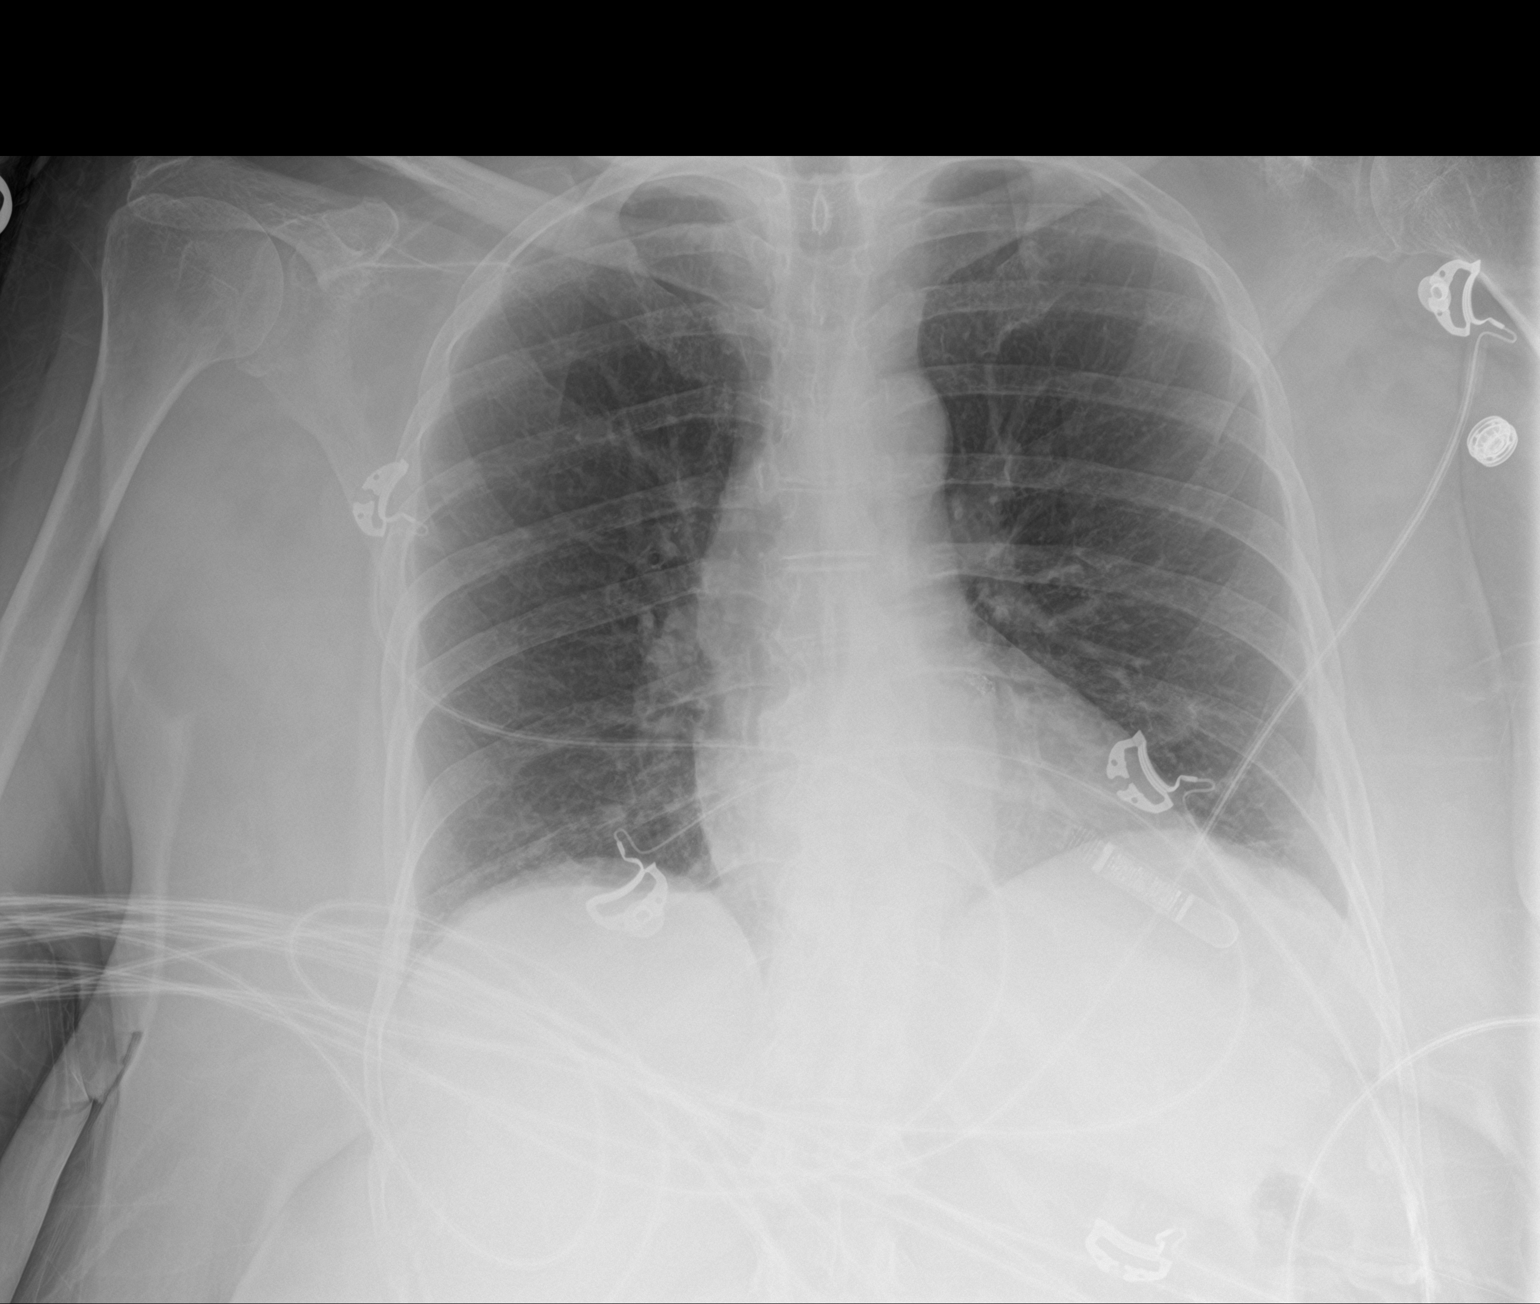

[1 of 1 positions shown; findings below may reference images not displayed]

FINDINGS: There is minimal atelectasis in the left base. Lungs elsewhere are
clear. Heart size and pulmonary vascularity are normal. No
adenopathy. There is a loop recorder on the left inferiorly.
IMPRESSION: Minimal left base atelectasis. No edema or consolidation. Heart size
normal.

## 2019-09-03 ENCOUNTER — Ambulatory Visit: Payer: Self-pay
